# Patient Record
Sex: Female | Born: 1995 | Race: Black or African American | Hispanic: No | Marital: Single | Smoking: Never smoker
Health system: Southern US, Community
[De-identification: ages and names within clinical notes are randomized; demographics above are authoritative.]

## PROBLEM LIST (undated history)

## (undated) DIAGNOSIS — Z789 Other specified health status: Secondary | ICD-10-CM

## (undated) HISTORY — PX: NO PAST SURGERIES: SHX2092

## (undated) HISTORY — DX: Other specified health status: Z78.9

---
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
## 2013-04-04 ENCOUNTER — Encounter: Payer: Self-pay | Admitting: *Deleted

## 2013-04-12 ENCOUNTER — Ambulatory Visit (INDEPENDENT_AMBULATORY_CARE_PROVIDER_SITE_OTHER): Payer: 59

## 2013-04-12 DIAGNOSIS — Z3009 Encounter for other general counseling and advice on contraception: Secondary | ICD-10-CM

## 2013-04-12 DIAGNOSIS — Z30013 Encounter for initial prescription of injectable contraceptive: Secondary | ICD-10-CM

## 2013-04-12 MED ORDER — MEDROXYPROGESTERONE ACETATE 150 MG/ML IM SUSP
150.0000 mg | Freq: Once | INTRAMUSCULAR | Status: AC
  Administered 2013-04-12: 150 mg via INTRAMUSCULAR

## 2013-04-12 NOTE — Progress Notes (Deleted)
  Subjective:    Patient ID: Amanda Mcmahon, female    DOB: October 31, 1996, 17 y.o.   MRN: [id number]  HPI    Review of Systems     Objective:   Physical Exam        Assessment & Plan:

## 2013-04-12 NOTE — Progress Notes (Signed)
Received Depo Provera injection on 01/24/2013 at [facility].

## 2014-03-15 ENCOUNTER — Ambulatory Visit (INDEPENDENT_AMBULATORY_CARE_PROVIDER_SITE_OTHER): Payer: BC Managed Care – PPO | Admitting: *Deleted

## 2014-03-15 DIAGNOSIS — Z309 Encounter for contraceptive management, unspecified: Secondary | ICD-10-CM

## 2014-03-15 DIAGNOSIS — IMO0001 Reserved for inherently not codable concepts without codable children: Secondary | ICD-10-CM

## 2014-03-15 LAB — POCT URINE PREGNANCY: Preg Test, Ur: NEGATIVE

## 2014-03-15 MED ORDER — MEDROXYPROGESTERONE ACETATE 150 MG/ML IM SUSP
150.0000 mg | Freq: Once | INTRAMUSCULAR | Status: AC
  Administered 2014-03-15: 150 mg via INTRAMUSCULAR

## 2014-03-15 NOTE — Patient Instructions (Signed)
Next depo provera due July 30 - Aug 13

## 2014-03-15 NOTE — Progress Notes (Signed)
Patient got last depo provera at school. Consult with Amanda Mcmahon. Urine pregnancy test was negative done in office today. Pt states she has not had sex in the past 7 days. Ok to give depo provera today per Solonarolyn.

## 2014-05-09 ENCOUNTER — Ambulatory Visit (INDEPENDENT_AMBULATORY_CARE_PROVIDER_SITE_OTHER): Payer: BC Managed Care – PPO | Admitting: Nurse Practitioner

## 2014-05-09 ENCOUNTER — Encounter: Payer: Self-pay | Admitting: Nurse Practitioner

## 2014-05-09 VITALS — BP 110/74 | Temp 98.2°F | Ht 63.0 in | Wt 112.0 lb

## 2014-05-09 DIAGNOSIS — Z113 Encounter for screening for infections with a predominantly sexual mode of transmission: Secondary | ICD-10-CM

## 2014-05-09 DIAGNOSIS — R300 Dysuria: Secondary | ICD-10-CM

## 2014-05-09 DIAGNOSIS — R1011 Right upper quadrant pain: Secondary | ICD-10-CM

## 2014-05-09 LAB — POCT URINALYSIS DIPSTICK: pH, UA: 7

## 2014-05-09 LAB — POCT URINE PREGNANCY: PREG TEST UR: NEGATIVE

## 2014-05-09 MED ORDER — SULFAMETHOXAZOLE-TMP DS 800-160 MG PO TABS: 1.0000 | ORAL_TABLET | Freq: Two times a day (BID) | ORAL | Status: DC

## 2014-05-10 LAB — HEPATIC FUNCTION PANEL
ALK PHOS: 59 U/L (ref 39–117)
ALT: 9 U/L (ref 0–35)
AST: 14 U/L (ref 0–37)
Albumin: 4.2 g/dL (ref 3.5–5.2)
BILIRUBIN DIRECT: 0.3 mg/dL (ref 0.0–0.3)
BILIRUBIN INDIRECT: 1.3 mg/dL — AB (ref 0.2–1.2)
BILIRUBIN TOTAL: 1.6 mg/dL — AB (ref 0.2–1.2)
Total Protein: 6.5 g/dL (ref 6.0–8.3)

## 2014-05-10 LAB — CBC WITH DIFFERENTIAL/PLATELET
Basophils Absolute: 0 10*3/uL (ref 0.0–0.1)
Basophils Relative: 0 % (ref 0–1)
Eosinophils Absolute: 0.1 10*3/uL (ref 0.0–0.7)
Eosinophils Relative: 1 % (ref 0–5)
HCT: 38.1 % (ref 36.0–46.0)
Hemoglobin: 13.5 g/dL (ref 12.0–15.0)
LYMPHS ABS: 1.4 10*3/uL (ref 0.7–4.0)
LYMPHS PCT: 25 % (ref 12–46)
MCH: 30.4 pg (ref 26.0–34.0)
MCHC: 35.4 g/dL (ref 30.0–36.0)
MCV: 85.8 fL (ref 78.0–100.0)
Monocytes Absolute: 0.3 10*3/uL (ref 0.1–1.0)
Monocytes Relative: 6 % (ref 3–12)
NEUTROS PCT: 68 % (ref 43–77)
Neutro Abs: 3.9 10*3/uL (ref 1.7–7.7)
PLATELETS: 273 10*3/uL (ref 150–400)
RBC: 4.44 MIL/uL (ref 3.87–5.11)
RDW: 12.6 % (ref 11.5–15.5)
WBC: 5.7 10*3/uL (ref 4.0–10.5)

## 2014-05-10 LAB — BASIC METABOLIC PANEL
BUN: 7 mg/dL (ref 6–23)
CHLORIDE: 108 meq/L (ref 96–112)
CO2: 23 meq/L (ref 19–32)
CREATININE: 0.9 mg/dL (ref 0.50–1.10)
Calcium: 9.3 mg/dL (ref 8.4–10.5)
Glucose, Bld: 107 mg/dL — ABNORMAL HIGH (ref 70–99)
Potassium: 3.9 mEq/L (ref 3.5–5.3)
Sodium: 139 mEq/L (ref 135–145)

## 2014-05-10 LAB — GC/CHLAMYDIA PROBE AMP, URINE
CHLAMYDIA, SWAB/URINE, PCR: NEGATIVE
GC Probe Amp, Urine: NEGATIVE

## 2014-05-11 LAB — URINE CULTURE
Colony Count: NO GROWTH
Organism ID, Bacteria: NO GROWTH

## 2014-05-15 ENCOUNTER — Encounter: Payer: Self-pay | Admitting: Nurse Practitioner

## 2014-05-15 LAB — POCT UA - MICROSCOPIC ONLY
Bacteria, U Microscopic: NEGATIVE
RBC, URINE, MICROSCOPIC: NEGATIVE

## 2014-05-15 NOTE — Progress Notes (Signed)
Subjective:  Presents for complaints of low back pain and urinary problems for the past 3 weeks. Pain and pressure at the end of urination. Back pain began about a week ago. None today. Some urgency and frequency. No dysuria. No hematuria. Mid to lower abdominal pain. No fever. No nausea vomiting. Bowels normal limit. No vaginal discharge. Same sexual partner. No history of recent UTI. On Depo-Provera. Had some slight bleeding last week none since.  Objective:   BP 110/74  Temp(Src) 98.2 F (36.8 C)  Ht 5\' 3"  (1.6 m)  Wt 112 lb (50.803 kg)  BMI 19.84 kg/m2  LMP 03/09/2014 NAD. Alert, oriented. Lungs clear. Heart regular rate rhythm. No CVA tenderness. Tenderness noted in the right flank area towards the posterior axillary line into the right upper quadrant of the abdomen. No obvious organomegaly. No rash noted. Minimal suprapubic area tenderness. Urine hCG normal. Urine microscopic normal.  Assessment: Dysuria - Plan: POCT urinalysis dipstick, POCT UA - Microscopic Only, Urine culture  Screen for STD (sexually transmitted disease) - Plan: GC/chlamydia probe amp, urine, Urine culture  RUQ abdominal pain - Plan: POCT urine pregnancy, CBC with Differential, Hepatic function panel, Basic metabolic panel, Urine culture  Plan:  Meds ordered this encounter  Medications  . sulfamethoxazole-trimethoprim (BACTRIM DS) 800-160 MG per tablet    Sig: Take 1 tablet by mouth 2 (two) times daily.    Dispense:  14 tablet    Refill:  0    Order Specific Question:  Supervising Provider    Answer:  Merlyn AlbertLUKING, WILLIAM S [[id number]]   AZO for 48 hours then discontinue. Warning signs reviewed. Further followup based on test results, patient to call or go to ED sooner if symptoms worsen.

## 2014-06-01 ENCOUNTER — Ambulatory Visit (INDEPENDENT_AMBULATORY_CARE_PROVIDER_SITE_OTHER): Payer: BC Managed Care – PPO | Admitting: *Deleted

## 2014-06-01 DIAGNOSIS — Z3042 Encounter for surveillance of injectable contraceptive: Secondary | ICD-10-CM

## 2014-06-01 DIAGNOSIS — Z3049 Encounter for surveillance of other contraceptives: Secondary | ICD-10-CM

## 2014-06-01 MED ORDER — MEDROXYPROGESTERONE ACETATE 150 MG/ML IM SUSP
150.0000 mg | Freq: Once | INTRAMUSCULAR | Status: AC
  Administered 2014-06-01: 150 mg via INTRAMUSCULAR

## 2014-08-01 ENCOUNTER — Telehealth: Payer: Self-pay | Admitting: Family Medicine

## 2014-08-01 NOTE — Telephone Encounter (Signed)
Fax number [phone]

## 2014-08-01 NOTE — Telephone Encounter (Signed)
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 She will call back with fax number.

## 2014-08-01 NOTE — Telephone Encounter (Signed)
Done

## 2014-12-05 ENCOUNTER — Encounter: Payer: Self-pay | Admitting: Nurse Practitioner

## 2014-12-05 ENCOUNTER — Ambulatory Visit (INDEPENDENT_AMBULATORY_CARE_PROVIDER_SITE_OTHER): Payer: Managed Care, Other (non HMO) | Admitting: Nurse Practitioner

## 2014-12-05 VITALS — BP 110/68 | Ht 63.0 in | Wt 119.8 lb

## 2014-12-05 DIAGNOSIS — N644 Mastodynia: Secondary | ICD-10-CM

## 2014-12-05 DIAGNOSIS — Z113 Encounter for screening for infections with a predominantly sexual mode of transmission: Secondary | ICD-10-CM

## 2014-12-05 DIAGNOSIS — Z309 Encounter for contraceptive management, unspecified: Secondary | ICD-10-CM

## 2014-12-05 LAB — POCT URINE PREGNANCY: Preg Test, Ur: NEGATIVE

## 2014-12-05 MED ORDER — MEDROXYPROGESTERONE ACETATE 150 MG/ML IM SUSP
150.0000 mg | Freq: Once | INTRAMUSCULAR | Status: AC
  Administered 2014-12-05: 150 mg via INTRAMUSCULAR

## 2014-12-05 NOTE — Progress Notes (Signed)
Subjective:  Presents to discuss restarting Depo Provera. Was supposed to have an injection on 1/15. Went to student health but they told her she would need a PAP smear first. Same sexual partner. No vaginal bleeding. Breast tenderness for a few days. Last intercourse 2 days ago.   Objective:   BP 110/68 mmHg  Ht 5\' 3"  (1.6 m)  Wt 119 lb 12.8 oz (54.341 kg)  BMI 21.23 kg/m2  LMP 10/04/2014 (Approximate) NAD. Alert, oriented. Lungs clear. Heart RRR. Breast exam: dense tissue with multiple nodularity; tender to palpation; no dominant masses; axillae no adenopathy.  Results for orders placed or performed in visit on 12/05/14  POCT urine pregnancy  Result Value Ref Range   Preg Test, Ur Negative      Assessment: Encounter for contraceptive management, unspecified encounter - Plan: POCT urine pregnancy, medroxyPROGESTERone (DEPO-PROVERA) injection 150 mg  Screening examination for STD (sexually transmitted disease) - Plan: GC/chlamydia probe amp, urine  Breast tenderness  Plan:  Meds ordered this encounter  Medications  . medroxyPROGESTERone (DEPO-PROVERA) injection 150 mg    Sig:    Patient understands there is a slim chance that she is pregnant even with neg test since she had intercourse so recently. Would like to get Depo Provera anyway. Advised her to take home pregnancy test in 7-10 days and call back if positive. Discussed safe sex issues. Recommend PE and first PAP this summer after she turns 19 per PAP guidelines. NSAIDs and ice/heat applications to breasts. Should resolve once she has been on Depo for a few weeks.

## 2014-12-06 LAB — GC/CHLAMYDIA PROBE AMP, URINE
CHLAMYDIA, SWAB/URINE, PCR: NEGATIVE
GC Probe Amp, Urine: NEGATIVE

## 2015-02-27 ENCOUNTER — Ambulatory Visit (INDEPENDENT_AMBULATORY_CARE_PROVIDER_SITE_OTHER): Payer: Managed Care, Other (non HMO) | Admitting: *Deleted

## 2015-02-27 DIAGNOSIS — Z309 Encounter for contraceptive management, unspecified: Secondary | ICD-10-CM | POA: Diagnosis not present

## 2015-02-27 MED ORDER — MEDROXYPROGESTERONE ACETATE 150 MG/ML IM SUSP
150.0000 mg | Freq: Once | INTRAMUSCULAR | Status: AC
  Administered 2015-02-27: 150 mg via INTRAMUSCULAR

## 2015-05-16 ENCOUNTER — Ambulatory Visit: Payer: Managed Care, Other (non HMO)

## 2015-05-17 ENCOUNTER — Ambulatory Visit (INDEPENDENT_AMBULATORY_CARE_PROVIDER_SITE_OTHER): Payer: Managed Care, Other (non HMO)

## 2015-05-17 DIAGNOSIS — Z3042 Encounter for surveillance of injectable contraceptive: Secondary | ICD-10-CM | POA: Diagnosis not present

## 2015-05-17 MED ORDER — MEDROXYPROGESTERONE ACETATE 150 MG/ML IM SUSP
150.0000 mg | Freq: Once | INTRAMUSCULAR | Status: AC
  Administered 2015-05-17: 150 mg via INTRAMUSCULAR

## 2015-08-02 ENCOUNTER — Ambulatory Visit (INDEPENDENT_AMBULATORY_CARE_PROVIDER_SITE_OTHER): Payer: Managed Care, Other (non HMO) | Admitting: *Deleted

## 2015-08-02 DIAGNOSIS — Z3042 Encounter for surveillance of injectable contraceptive: Secondary | ICD-10-CM

## 2015-08-02 MED ORDER — MEDROXYPROGESTERONE ACETATE 150 MG/ML IM SUSP
150.0000 mg | Freq: Once | INTRAMUSCULAR | Status: AC
  Administered 2015-08-02: 150 mg via INTRAMUSCULAR

## 2015-11-03 NOTE — L&D Delivery Note (Signed)
Delivery Note At 5:42 PM a viable female was delivered via Vaginal, Spontaneous Delivery (Presentation: ROA ; Vertex  ).  APGAR: 4, 7; weight pending .   Placenta status: delivered intact with gentle traction.  Cord: 3 vessles  with the following complications: none .    Anesthesia:  epidural Episiotomy: None Lacerations: 2nd degree Suture Repair: 3.0 vicryl Est. Blood Loss (mL): 100  Mom to postpartum.  Baby to Couplet care / Skin to Skin.  Ernestina Pennaicholas Schenk 09/19/2016, 6:11 PM

## 2016-01-24 ENCOUNTER — Other Ambulatory Visit (HOSPITAL_COMMUNITY)
Admission: RE | Admit: 2016-01-24 | Discharge: 2016-01-24 | Disposition: A | Discharge status: Home / Self Care | Payer: Managed Care, Other (non HMO) | Source: Ambulatory Visit | Attending: Nurse Practitioner | Admitting: Nurse Practitioner

## 2016-01-24 ENCOUNTER — Ambulatory Visit (INDEPENDENT_AMBULATORY_CARE_PROVIDER_SITE_OTHER): Payer: Managed Care, Other (non HMO) | Admitting: Nurse Practitioner

## 2016-01-24 VITALS — BP 112/78 | Ht 63.0 in | Wt 139.2 lb

## 2016-01-24 DIAGNOSIS — Z3200 Encounter for pregnancy test, result unknown: Secondary | ICD-10-CM | POA: Diagnosis present

## 2016-01-24 DIAGNOSIS — N912 Amenorrhea, unspecified: Secondary | ICD-10-CM

## 2016-01-24 LAB — HCG, QUANTITATIVE, PREGNANCY: hCG, Beta Chain, Quant, S: 9093 m[IU]/mL — ABNORMAL HIGH (ref <5)

## 2016-01-24 LAB — POCT URINE PREGNANCY: PREG TEST UR: POSITIVE — AB

## 2016-01-25 ENCOUNTER — Encounter: Payer: Self-pay | Admitting: Nurse Practitioner

## 2016-01-25 NOTE — Progress Notes (Signed)
Subjective:  Presents to discuss 2 positive pregnancy test taken outside the office. Had her last Depo-Provera injection on 9/30. No cycle for the past 4-5 months. No vaginal discharge, fever or spotting.  Objective:   BP 112/78 mmHg  Ht 5\' 3"  (1.6 m)  Wt 139 lb 4 oz (63.163 kg)  BMI 24.67 kg/m2  LMP  (LMP Unknown) NAD. Alert, oriented. Lungs clear. Heart regular rate rhythm. Abdomen soft nondistended nontender. No palpable fundal height. Results for orders placed or performed in visit on 01/24/16  POCT urine pregnancy  Result Value Ref Range   Preg Test, Ur Positive (A) Negative     Assessment: Encounter for pregnancy test - Plan: POCT urine pregnancy, hCG, quantitative, pregnancy  Amenorrhea - Plan: hCG, quantitative, pregnancy  Plan: Stat quantitative hCG to get an estimate of gestation. Patient to call and make appointment for prenatal visit once this is available. [facility] [place]. Also discussed basic early prenatal care. Start OTC prenatal vitamin. Patient was in a hot tub last weekend, advised her to avoid that from now on.

## 2016-02-07 ENCOUNTER — Other Ambulatory Visit: Payer: Self-pay | Admitting: Obstetrics and Gynecology

## 2016-02-07 DIAGNOSIS — O3680X0 Pregnancy with inconclusive fetal viability, not applicable or unspecified: Secondary | ICD-10-CM

## 2016-02-10 ENCOUNTER — Ambulatory Visit (INDEPENDENT_AMBULATORY_CARE_PROVIDER_SITE_OTHER): Payer: Managed Care, Other (non HMO)

## 2016-02-10 DIAGNOSIS — O3680X0 Pregnancy with inconclusive fetal viability, not applicable or unspecified: Secondary | ICD-10-CM

## 2016-02-10 DIAGNOSIS — Z3A08 8 weeks gestation of pregnancy: Secondary | ICD-10-CM

## 2016-02-10 NOTE — Progress Notes (Signed)
US 8wks,single IUP w/ys, pos fht 148 bpm,normal ov's bilat,crl 16.605mm

## 2016-02-19 ENCOUNTER — Encounter: Payer: Self-pay | Admitting: Women's Health

## 2016-02-19 ENCOUNTER — Ambulatory Visit (INDEPENDENT_AMBULATORY_CARE_PROVIDER_SITE_OTHER): Payer: Managed Care, Other (non HMO) | Admitting: Women's Health

## 2016-02-19 ENCOUNTER — Other Ambulatory Visit (HOSPITAL_COMMUNITY)
Admission: RE | Admit: 2016-02-19 | Discharge: 2016-02-19 | Disposition: A | Discharge status: Home / Self Care | Payer: Managed Care, Other (non HMO) | Source: Ambulatory Visit | Attending: Obstetrics & Gynecology | Admitting: Obstetrics & Gynecology

## 2016-02-19 VITALS — BP 104/60 | HR 92 | Wt 141.0 lb

## 2016-02-19 DIAGNOSIS — Z369 Encounter for antenatal screening, unspecified: Secondary | ICD-10-CM

## 2016-02-19 DIAGNOSIS — Z113 Encounter for screening for infections with a predominantly sexual mode of transmission: Secondary | ICD-10-CM | POA: Diagnosis present

## 2016-02-19 DIAGNOSIS — Z01419 Encounter for gynecological examination (general) (routine) without abnormal findings: Secondary | ICD-10-CM | POA: Insufficient documentation

## 2016-02-19 DIAGNOSIS — Z124 Encounter for screening for malignant neoplasm of cervix: Secondary | ICD-10-CM

## 2016-02-19 DIAGNOSIS — Z0283 Encounter for blood-alcohol and blood-drug test: Secondary | ICD-10-CM

## 2016-02-19 DIAGNOSIS — Z3401 Encounter for supervision of normal first pregnancy, first trimester: Secondary | ICD-10-CM | POA: Diagnosis not present

## 2016-02-19 DIAGNOSIS — Z3682 Encounter for antenatal screening for nuchal translucency: Secondary | ICD-10-CM

## 2016-02-19 DIAGNOSIS — Z1389 Encounter for screening for other disorder: Secondary | ICD-10-CM

## 2016-02-19 DIAGNOSIS — Z3400 Encounter for supervision of normal first pregnancy, unspecified trimester: Secondary | ICD-10-CM | POA: Insufficient documentation

## 2016-02-19 DIAGNOSIS — Z331 Pregnant state, incidental: Secondary | ICD-10-CM

## 2016-02-19 LAB — POCT URINALYSIS DIPSTICK
Blood, UA: NEGATIVE
Glucose, UA: NEGATIVE
Ketones, UA: NEGATIVE
LEUKOCYTES UA: NEGATIVE
NITRITE UA: NEGATIVE
PROTEIN UA: NEGATIVE

## 2016-02-19 MED ORDER — CITRANATAL ASSURE 35-1 & 300 MG PO MISC: ORAL | Status: DC

## 2016-02-19 MED ORDER — DOXYLAMINE-PYRIDOXINE 10-10 MG PO TBEC: DELAYED_RELEASE_TABLET | ORAL | Status: DC

## 2016-02-19 NOTE — Patient Instructions (Signed)
Zyrtec or Claritin for allergies   Nausea & Vomiting  Have saltine crackers or pretzels by your bed and eat a few bites before you raise your head out of bed in the morning  Eat small frequent meals throughout the day instead of large meals  Drink plenty of fluids throughout the day to stay hydrated, just don't drink a lot of fluids with your meals.  This can make your stomach fill up faster making you feel sick  Do not brush your teeth right after you eat  Products with real ginger are good for nausea, like ginger ale and ginger hard candy Make sure it says made with real ginger!  Sucking on sour candy like lemon heads is also good for nausea  If your prenatal vitamins make you nauseated, take them at night so you will sleep through the nausea  Sea Bands  If you feel like you need medicine for the nausea & vomiting please let us know  If you are unable to keep any fluids or food down please let us know   Constipation  Drink plenty of fluid, preferably water, throughout the day  Eat foods high in fiber such as fruits, vegetables, and grains  Exercise, such as walking, is a good way to keep your bowels regular  Drink warm fluids, especially warm prune juice, or decaf coffee  Eat a 1/2 cup of real oatmeal (not instant), 1/2 cup applesauce, and 1/2-1 cup warm prune juice every day  If needed, you may take Colace (docusate sodium) stool softener once or twice a day to help keep the stool soft. If you are pregnant, wait until you are out of your first trimester (12-14 weeks of pregnancy)  If you still are having problems with constipation, you may take Miralax once daily as needed to help keep your bowels regular.  If you are pregnant, wait until you are out of your first trimester (12-14 weeks of pregnancy)   First Trimester of Pregnancy The first trimester of pregnancy is from week 1 until the end of week 12 (months 1 through 3). A week after a sperm fertilizes an egg, the egg  will implant on the wall of the uterus. This embryo will begin to develop into a baby. Genes from you and your partner are forming the baby. The female genes determine whether the baby is a boy or a girl. At 6-8 weeks, the eyes and face are formed, and the heartbeat can be seen on ultrasound. At the end of 12 weeks, all the baby's organs are formed.  Now that you are pregnant, you will want to do everything you can to have a healthy baby. Two of the most important things are to get good prenatal care and to follow your health care provider's instructions. Prenatal care is all the medical care you receive before the baby's birth. This care will help prevent, find, and treat any problems during the pregnancy and childbirth. BODY CHANGES Your body goes through many changes during pregnancy. The changes vary from woman to woman.   You may gain or lose a couple of pounds at first.  You may feel sick to your stomach (nauseous) and throw up (vomit). If the vomiting is uncontrollable, call your health care provider.  You may tire easily.  You may develop headaches that can be relieved by medicines approved by your health care provider.  You may urinate more often. Painful urination may mean you have a bladder infection.  You may develop  heartburn as a result of your pregnancy.  You may develop constipation because certain hormones are causing the muscles that push waste through your intestines to slow down.  You may develop hemorrhoids or swollen, bulging veins (varicose veins).  Your breasts may begin to grow larger and become tender. Your nipples may stick out more, and the tissue that surrounds them (areola) may become darker.  Your gums may bleed and may be sensitive to brushing and flossing.  Dark spots or blotches (chloasma, mask of pregnancy) may develop on your face. This will likely fade after the baby is born.  Your menstrual periods will stop.  You may have a loss of appetite.  You  may develop cravings for certain kinds of food.  You may have changes in your emotions from day to day, such as being excited to be pregnant or being concerned that something may go wrong with the pregnancy and baby.  You may have more vivid and strange dreams.  You may have changes in your hair. These can include thickening of your hair, rapid growth, and changes in texture. Some women also have hair loss during or after pregnancy, or hair that feels dry or thin. Your hair will most likely return to normal after your baby is born. WHAT TO EXPECT AT YOUR PRENATAL VISITS During a routine prenatal visit:  You will be weighed to make sure you and the baby are growing normally.  Your blood pressure will be taken.  Your abdomen will be measured to track your baby's growth.  The fetal heartbeat will be listened to starting around week 10 or 12 of your pregnancy.  Test results from any previous visits will be discussed. Your health care provider may ask you:  How you are feeling.  If you are feeling the baby move.  If you have had any abnormal symptoms, such as leaking fluid, bleeding, severe headaches, or abdominal cramping.  If you have any questions. Other tests that may be performed during your first trimester include:  Blood tests to find your blood type and to check for the presence of any previous infections. They will also be used to check for low iron levels (anemia) and Rh antibodies. Later in the pregnancy, blood tests for diabetes will be done along with other tests if problems develop.  Urine tests to check for infections, diabetes, or protein in the urine.  An ultrasound to confirm the proper growth and development of the baby.  An amniocentesis to check for possible genetic problems.  Fetal screens for spina bifida and Down syndrome.  You may need other tests to make sure you and the baby are doing well. HOME CARE INSTRUCTIONS  Medicines  Follow your health care  provider's instructions regarding medicine use. Specific medicines may be either safe or unsafe to take during pregnancy.  Take your prenatal vitamins as directed.  If you develop constipation, try taking a stool softener if your health care provider approves. Diet  Eat regular, well-balanced meals. Choose a variety of foods, such as meat or vegetable-based protein, fish, milk and low-fat dairy products, vegetables, fruits, and whole grain breads and cereals. Your health care provider will help you determine the amount of weight gain that is right for you.  Avoid raw meat and uncooked cheese. These carry germs that can cause birth defects in the baby.  Eating four or five small meals rather than three large meals a day may help relieve nausea and vomiting. If you start to feel  nauseous, eating a few soda crackers can be helpful. Drinking liquids between meals instead of during meals also seems to help nausea and vomiting.  If you develop constipation, eat more high-fiber foods, such as fresh vegetables or fruit and whole grains. Drink enough fluids to keep your urine clear or pale yellow. Activity and Exercise  Exercise only as directed by your health care provider. Exercising will help you:  Control your weight.  Stay in shape.  Be prepared for labor and delivery.  Experiencing pain or cramping in the lower abdomen or low back is a good sign that you should stop exercising. Check with your health care provider before continuing normal exercises.  Try to avoid standing for long periods of time. Move your legs often if you must stand in one place for a long time.  Avoid heavy lifting.  Wear low-heeled shoes, and practice good posture.  You may continue to have sex unless your health care provider directs you otherwise. Relief of Pain or Discomfort  Wear a good support bra for breast tenderness.   Take warm sitz baths to soothe any pain or discomfort caused by hemorrhoids. Use  hemorrhoid cream if your health care provider approves.   Rest with your legs elevated if you have leg cramps or low back pain.  If you develop varicose veins in your legs, wear support hose. Elevate your feet for 15 minutes, 3-4 times a day. Limit salt in your diet. Prenatal Care  Schedule your prenatal visits by the twelfth week of pregnancy. They are usually scheduled monthly at first, then more often in the last 2 months before delivery.  Write down your questions. Take them to your prenatal visits.  Keep all your prenatal visits as directed by your health care provider. Safety  Wear your seat belt at all times when driving.  Make a list of emergency phone numbers, including numbers for family, friends, the hospital, and police and fire departments. General Tips  Ask your health care provider for a referral to a local prenatal education class. Begin classes no later than at the beginning of month 6 of your pregnancy.  Ask for help if you have counseling or nutritional needs during pregnancy. Your health care provider can offer advice or refer you to specialists for help with various needs.  Do not use hot tubs, steam rooms, or saunas.  Do not douche or use tampons or scented sanitary pads.  Do not cross your legs for long periods of time.  Avoid cat litter boxes and soil used by cats. These carry germs that can cause birth defects in the baby and possibly loss of the fetus by miscarriage or stillbirth.  Avoid all smoking, herbs, alcohol, and medicines not prescribed by your health care provider. Chemicals in these affect the formation and growth of the baby.  Schedule a dentist appointment. At home, brush your teeth with a soft toothbrush and be gentle when you floss. SEEK MEDICAL CARE IF:   You have dizziness.  You have mild pelvic cramps, pelvic pressure, or nagging pain in the abdominal area.  You have persistent nausea, vomiting, or diarrhea.  You have a bad  smelling vaginal discharge.  You have pain with urination.  You notice increased swelling in your face, hands, legs, or ankles. SEEK IMMEDIATE MEDICAL CARE IF:   You have a fever.  You are leaking fluid from your vagina.  You have spotting or bleeding from your vagina.  You have severe abdominal cramping or pain.  You have rapid weight gain or loss.  You vomit blood or material that looks like coffee grounds.  You are exposed to [place] measles and have never had them.  You are exposed to fifth disease or chickenpox.  You develop a severe headache.  You have shortness of breath.  You have any kind of trauma, such as from a fall or a car accident. Document Released: 10/13/2001 Document Revised: 03/05/2014 Document Reviewed: 08/29/2013 [facility] Patient Information 2015 [place], [place]. This information is not intended to replace advice given to you by your health care provider. Make sure you discuss any questions you have with your health care provider.

## 2016-02-19 NOTE — Progress Notes (Signed)
  Subjective:  Carmine SavoyKierra D Sobocinski is a 20 y.o. G1P0 African American female at 8048w2d by 8wk u/s, being seen today for her first obstetrical visit.  Her obstetrical history is significant for primigravida.  Pregnancy history fully reviewed.  Patient reports allergies- runny/itchy eyes, nose, throat, sneezing. N/V- vomiting not daily- requests meds. Denies vb, cramping, uti s/s, abnormal/malodorous vag d/c, or vulvovaginal itching/irritation.  BP 104/60 mmHg  Pulse 92  Wt 141 lb (63.957 kg)  LMP  (LMP Unknown)  HISTORY: OB History  Gravida Para Term Preterm AB SAB TAB Ectopic Multiple Living  1             # Outcome Date GA Lbr Len/2nd Weight Sex Delivery Anes PTL Lv  1 Current              Past Medical History  Diagnosis Date  . Medical history non-contributory    Past Surgical History  Procedure Laterality Date  . No past surgeries     Family History  Problem Relation Age of Onset  . Arthritis Mother   . Asthma Maternal Grandmother   . Cancer Maternal Grandmother     lung  . COPD Maternal Grandmother   . Depression Maternal Grandmother   . Diabetes Maternal Grandmother   . Hypertension Maternal Grandmother   . Stroke Maternal Grandmother   . Hypertension Maternal Grandfather   . Diabetes Paternal Grandmother     Exam   System:     General: Well developed & nourished, no acute distress   Skin: Warm & dry, normal coloration and turgor, no rashes   Neurologic: Alert & oriented, normal mood   Cardiovascular: Regular rate & rhythm   Respiratory: Effort & rate normal, LCTAB, acyanotic   Abdomen: Soft, non tender   Extremities: normal strength, tone   Pelvic Exam:    Perineum: Normal perineum   Vulva: Normal, no lesions   Vagina:  Normal mucosa, normal discharge   Cervix: VERY ANTERIOR, Normal, bulbous, appears closed   Uterus: Normal size/shape/contour for GA   Thin prep pap smear obtained w/ reflex high risk HPV cotesting FHR: 160s via informal u/s   Assessment:    Pregnancy: G1P0 Patient Active Problem List   Diagnosis Date Noted  . Supervision of normal first pregnancy 02/19/2016    Priority: High  . KNEE SPRAIN 07/28/2010    4348w2d G1P0 New OB visit N/V of pregnancy Seasonal allergies  Plan:  Initial labs drawn Continue prenatal vitamins Problem list reviewed and updated Reviewed n/v relief measures and warning s/s to report Rx diclegis, coupon card given- if has any trouble to let us know, will rx phenergan.  Zyrtec or Claritin for allergies Reviewed recommended weight gain based on pre-gravid BMI Encouraged well-balanced diet Genetic Screening discussed Integrated Screen: requested Cystic fibrosis screening discussed declined Ultrasound discussed; fetal survey: requested Follow up in 3 weeks for 1st it/nt and visit [facility] completed  Marge DuncansBooker, Tobey Schmelzle Randall CNM, [facility]NP[facility]BC 02/19/2016 11:38 AM

## 2016-02-20 LAB — URINALYSIS, ROUTINE W REFLEX MICROSCOPIC
Bilirubin, UA: NEGATIVE
GLUCOSE, UA: NEGATIVE
KETONES UA: NEGATIVE
Leukocytes, UA: NEGATIVE
NITRITE UA: NEGATIVE
PROTEIN UA: NEGATIVE
RBC, UA: NEGATIVE
SPEC GRAV UA: 1.023 (ref 1.005–1.030)
UUROB: 1 mg/dL (ref 0.2–1.0)
pH, UA: 6.5 (ref 5.0–7.5)

## 2016-02-20 LAB — CBC
HEMATOCRIT: 39.8 % (ref 34.0–46.6)
HEMOGLOBIN: 14 g/dL (ref 11.1–15.9)
MCH: 31.7 pg (ref 26.6–33.0)
MCHC: 35.2 g/dL (ref 31.5–35.7)
MCV: 90 fL (ref 79–97)
PLATELETS: 308 10*3/uL (ref 150–379)
RBC: 4.42 x10E6/uL (ref 3.77–5.28)
RDW: 12.8 % (ref 12.3–15.4)
WBC: 11.1 10*3/uL — AB (ref 3.4–10.8)

## 2016-02-20 LAB — VARICELLA ZOSTER ANTIBODY, IGG: Varicella zoster IgG: 1942 index (ref >165)

## 2016-02-20 LAB — PMP SCREEN PROFILE (10S), URINE
Amphetamine Screen, Ur: NEGATIVE ng/mL
BARBITURATE SCRN UR: NEGATIVE ng/mL
Benzodiazepine Screen, Urine: NEGATIVE ng/mL
COCAINE(METAB.) SCREEN, URINE: NEGATIVE ng/mL
Cannabinoids Ur Ql Scn: NEGATIVE ng/mL
Creatinine(Crt), U: 154.9 mg/dL (ref 20.0–300.0)
METHADONE SCREEN, URINE: NEGATIVE ng/mL
OPIATE SCRN UR: NEGATIVE ng/mL
Oxycodone+Oxymorphone Ur Ql Scn: NEGATIVE ng/mL
PCP SCRN UR: NEGATIVE ng/mL
PH UR, DRUG SCRN: 6.1 (ref 4.5–8.9)
Propoxyphene, Screen: NEGATIVE ng/mL

## 2016-02-20 LAB — ABO/RH: RH TYPE: POSITIVE

## 2016-02-20 LAB — HIV ANTIBODY (ROUTINE TESTING W REFLEX): HIV Screen 4th Generation wRfx: NONREACTIVE

## 2016-02-20 LAB — SICKLE CELL SCREEN: SICKLE CELL SCREEN: NEGATIVE

## 2016-02-20 LAB — RPR: RPR: NONREACTIVE

## 2016-02-20 LAB — CYTOLOGY - PAP

## 2016-02-20 LAB — HEPATITIS B SURFACE ANTIGEN: Hepatitis B Surface Ag: NEGATIVE

## 2016-02-20 LAB — ANTIBODY SCREEN: Antibody Screen: NEGATIVE

## 2016-02-20 LAB — RUBELLA SCREEN: RUBELLA: 13.3 {index} (ref >0.99)

## 2016-02-21 LAB — URINE CULTURE: ORGANISM ID, BACTERIA: NO GROWTH

## 2016-03-11 ENCOUNTER — Ambulatory Visit (INDEPENDENT_AMBULATORY_CARE_PROVIDER_SITE_OTHER): Payer: Managed Care, Other (non HMO) | Admitting: Advanced Practice Midwife

## 2016-03-11 ENCOUNTER — Ambulatory Visit (INDEPENDENT_AMBULATORY_CARE_PROVIDER_SITE_OTHER): Payer: Managed Care, Other (non HMO)

## 2016-03-11 VITALS — BP 100/60 | HR 68 | Wt 141.0 lb

## 2016-03-11 DIAGNOSIS — Z1389 Encounter for screening for other disorder: Secondary | ICD-10-CM

## 2016-03-11 DIAGNOSIS — Z331 Pregnant state, incidental: Secondary | ICD-10-CM

## 2016-03-11 DIAGNOSIS — Z3682 Encounter for antenatal screening for nuchal translucency: Secondary | ICD-10-CM

## 2016-03-11 DIAGNOSIS — Z36 Encounter for antenatal screening of mother: Secondary | ICD-10-CM

## 2016-03-11 DIAGNOSIS — Z3401 Encounter for supervision of normal first pregnancy, first trimester: Secondary | ICD-10-CM

## 2016-03-11 LAB — POCT URINALYSIS DIPSTICK
Blood, UA: NEGATIVE
Glucose, UA: NEGATIVE
KETONES UA: NEGATIVE
Leukocytes, UA: NEGATIVE
Nitrite, UA: NEGATIVE
PROTEIN UA: NEGATIVE

## 2016-03-11 NOTE — Progress Notes (Signed)
G1P0 8452w2d Estimated Date of Delivery: 09/21/16  There were no vitals taken for this visit.   BP weight and urine results all reviewed and noted.  Please refer to the obstetrical flow sheet for the fundal height and fetal heart rate documentation:  Patient denies any bleeding and no rupture of membranes symptoms or regular contractions.   US 12+2 wks,measurements c/w dates,normal ov's bilat,fhr 164 bpm,crl 57.0 mm,NB present,NT 1.2 mm,post pl gr 0        Patient is without complaints. All questions were answered.  Orders Placed This Encounter  Procedures  . POCT urinalysis dipstick    Plan:  Continued routine obstetrical care, NT/It today  Return in about 4 weeks (around 04/08/2016) for LROB, 2nd IT.

## 2016-03-11 NOTE — Progress Notes (Signed)
US 12+2 wks,measurements c/w dates,normal ov's bilat,fhr 164 bpm,crl 57.0 mm,NB present,NT 1.2 mm,post pl gr 0

## 2016-03-17 ENCOUNTER — Telehealth: Payer: Self-pay | Admitting: *Deleted

## 2016-03-17 NOTE — Telephone Encounter (Signed)
Pt c/o sharp right sided, no vaginal bleeding. Pt informed to push water, rest, take Tylenol. If no improvement call our office back. Pt verbalized understanding.

## 2016-03-21 ENCOUNTER — Encounter (HOSPITAL_COMMUNITY): Payer: Self-pay | Admitting: Emergency Medicine

## 2016-03-21 ENCOUNTER — Emergency Department (HOSPITAL_COMMUNITY)
Admission: EM | Admit: 2016-03-21 | Discharge: 2016-03-22 | Disposition: A | Discharge status: Home / Self Care | Payer: Managed Care, Other (non HMO) | Attending: Emergency Medicine | Admitting: Emergency Medicine

## 2016-03-21 ENCOUNTER — Emergency Department (HOSPITAL_COMMUNITY): Payer: Managed Care, Other (non HMO)

## 2016-03-21 DIAGNOSIS — Y998 Other external cause status: Secondary | ICD-10-CM | POA: Diagnosis not present

## 2016-03-21 DIAGNOSIS — Y9389 Activity, other specified: Secondary | ICD-10-CM | POA: Diagnosis not present

## 2016-03-21 DIAGNOSIS — O9A212 Injury, poisoning and certain other consequences of external causes complicating pregnancy, second trimester: Secondary | ICD-10-CM | POA: Insufficient documentation

## 2016-03-21 DIAGNOSIS — Z3A13 13 weeks gestation of pregnancy: Secondary | ICD-10-CM | POA: Diagnosis not present

## 2016-03-21 DIAGNOSIS — S199XXA Unspecified injury of neck, initial encounter: Secondary | ICD-10-CM | POA: Insufficient documentation

## 2016-03-21 DIAGNOSIS — S8991XA Unspecified injury of right lower leg, initial encounter: Secondary | ICD-10-CM | POA: Insufficient documentation

## 2016-03-21 DIAGNOSIS — Y92410 Unspecified street and highway as the place of occurrence of the external cause: Secondary | ICD-10-CM | POA: Insufficient documentation

## 2016-03-21 DIAGNOSIS — S3991XA Unspecified injury of abdomen, initial encounter: Secondary | ICD-10-CM | POA: Diagnosis not present

## 2016-03-21 DIAGNOSIS — Z3490 Encounter for supervision of normal pregnancy, unspecified, unspecified trimester: Secondary | ICD-10-CM

## 2016-03-21 DIAGNOSIS — M542 Cervicalgia: Secondary | ICD-10-CM

## 2016-03-21 DIAGNOSIS — Z3401 Encounter for supervision of normal first pregnancy, first trimester: Secondary | ICD-10-CM

## 2016-03-21 DIAGNOSIS — R109 Unspecified abdominal pain: Secondary | ICD-10-CM

## 2016-03-21 DIAGNOSIS — Z79899 Other long term (current) drug therapy: Secondary | ICD-10-CM | POA: Insufficient documentation

## 2016-03-21 DIAGNOSIS — M25561 Pain in right knee: Secondary | ICD-10-CM

## 2016-03-21 LAB — URINALYSIS, ROUTINE W REFLEX MICROSCOPIC
Bilirubin Urine: NEGATIVE
Glucose, UA: NEGATIVE mg/dL
Hgb urine dipstick: NEGATIVE
Ketones, ur: NEGATIVE mg/dL
NITRITE: NEGATIVE
PROTEIN: NEGATIVE mg/dL
SPECIFIC GRAVITY, URINE: 1.012 (ref 1.005–1.030)
pH: 7.5 (ref 5.0–8.0)

## 2016-03-21 LAB — URINE MICROSCOPIC-ADD ON

## 2016-03-21 MED ORDER — ACETAMINOPHEN 325 MG PO TABS
650.0000 mg | ORAL_TABLET | Freq: Once | ORAL | Status: AC
  Administered 2016-03-21: 650 mg via ORAL
  Filled 2016-03-21: qty 2

## 2016-03-21 MED ORDER — ACETAMINOPHEN 325 MG PO TABS: 650.0000 mg | ORAL_TABLET | Freq: Once | ORAL | Status: DC

## 2016-03-21 NOTE — ED Notes (Signed)
EDP at bedside  
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
## 2016-03-21 NOTE — ED Notes (Signed)
In room with EDP, several family members/friends at bedside (more than 2) also on Facetime with staff in background. EDP asked friends to stop.

## 2016-03-21 NOTE — Discharge Instructions (Signed)
Follow-up with your primary care provider regarding your knee and neck pain and follow-up with your OB/GYN on Monday regarding your visit to the emergency department today.  Return to the emergency department if you experience vaginal bleeding, worsening abdominal cramps, vomiting, fever, chills, worsening knee pain, numbness/tingling in your legs or feet.  Joint Pain Joint pain, which is also called arthralgia, can be caused by many things. Joint pain often goes away when you follow your health care provider's instructions for relieving pain at home. However, joint pain can also be caused by conditions that require further treatment. Common causes of joint pain include:  Bruising in the area of the joint.  Overuse of the joint.  Wear and tear on the joints that occur with aging (osteoarthritis).  Various other forms of arthritis.  A buildup of a crystal form of uric acid in the joint (gout).  Infections of the joint (septic arthritis) or of the bone (osteomyelitis). Your health care provider may recommend medicine to help with the pain. If your joint pain continues, additional tests may be needed to diagnose your condition. HOME CARE INSTRUCTIONS Watch your condition for any changes. Follow these instructions as directed to lessen the pain that you are feeling.  Take medicines only as directed by your health care provider.  Rest the affected area for as long as your health care provider says that you should. If directed to do so, raise the painful joint above the level of your heart while you are sitting or lying down.  Do not do things that cause or worsen pain.  If directed, apply ice to the painful area:  Put ice in a plastic bag.  Place a towel between your skin and the bag.  Leave the ice on for 20 minutes, 2-3 times per day.  Wear an elastic bandage, splint, or sling as directed by your health care provider. Loosen the elastic bandage or splint if your fingers or toes  become numb and tingle, or if they turn cold and blue.  Begin exercising or stretching the affected area as directed by your health care provider. Ask your health care provider what types of exercise are safe for you.  Keep all follow-up visits as directed by your health care provider. This is important. SEEK MEDICAL CARE IF:  Your pain increases, and medicine does not help.  Your joint pain does not improve within 3 days.  You have increased bruising or swelling.  You have a fever.  You lose 10 lb (4.5 kg) or more without trying. SEEK IMMEDIATE MEDICAL CARE IF:  You are not able to move the joint.  Your fingers or toes become numb or they turn cold and blue.   This information is not intended to replace advice given to you by your health care provider. Make sure you discuss any questions you have with your health care provider.   Document Released: 10/19/2005 Document Revised: 11/09/2014 Document Reviewed: 07/31/2014 Elsevier Interactive Patient Education [employer]2016 Elsevier Inc.

## 2016-03-21 NOTE — ED Notes (Signed)
This note also relates to the following rows which could not be included: Pulse Rate - Cannot attach notes to unvalidated device data SpO2 - Cannot attach notes to unvalidated device data   Pt reports some mild cramping in lower abdomen; no bleeding and no leaking of fluid. RROB went over PTL precautions; pt and significant other verbalized understanding. [facility]

## 2016-03-21 NOTE — ED Notes (Signed)
OB RR at bedside 

## 2016-03-21 NOTE — ED Notes (Signed)
Charge RN aware that family wants to speak to him
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
## 2016-03-22 LAB — WET PREP, GENITAL
SPERM: NONE SEEN
TRICH WET PREP: NONE SEEN
YEAST WET PREP: NONE SEEN

## 2016-03-23 ENCOUNTER — Encounter: Payer: Self-pay | Admitting: Obstetrics and Gynecology

## 2016-03-23 ENCOUNTER — Ambulatory Visit (INDEPENDENT_AMBULATORY_CARE_PROVIDER_SITE_OTHER): Payer: Managed Care, Other (non HMO) | Admitting: Obstetrics & Gynecology

## 2016-03-23 DIAGNOSIS — Z1389 Encounter for screening for other disorder: Secondary | ICD-10-CM

## 2016-03-23 DIAGNOSIS — Z331 Pregnant state, incidental: Secondary | ICD-10-CM

## 2016-03-23 LAB — POCT URINALYSIS DIPSTICK
GLUCOSE UA: NEGATIVE
KETONES UA: NEGATIVE
LEUKOCYTES UA: NEGATIVE
Nitrite, UA: NEGATIVE
PROTEIN UA: NEGATIVE

## 2016-03-23 LAB — GC/CHLAMYDIA PROBE AMP (~~LOC~~) NOT AT ARMC
Chlamydia: NEGATIVE
Neisseria Gonorrhea: NEGATIVE
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
## 2016-04-08 ENCOUNTER — Ambulatory Visit (INDEPENDENT_AMBULATORY_CARE_PROVIDER_SITE_OTHER): Payer: Managed Care, Other (non HMO) | Admitting: Obstetrics and Gynecology

## 2016-04-08 ENCOUNTER — Encounter: Payer: Self-pay | Admitting: Obstetrics and Gynecology

## 2016-04-08 ENCOUNTER — Telehealth: Payer: Self-pay | Admitting: Obstetrics and Gynecology

## 2016-04-08 VITALS — BP 100/60 | HR 114 | Wt 144.0 lb

## 2016-04-08 DIAGNOSIS — Z3A17 17 weeks gestation of pregnancy: Secondary | ICD-10-CM

## 2016-04-08 DIAGNOSIS — Z331 Pregnant state, incidental: Secondary | ICD-10-CM

## 2016-04-08 DIAGNOSIS — Z1389 Encounter for screening for other disorder: Secondary | ICD-10-CM

## 2016-04-08 DIAGNOSIS — Z369 Encounter for antenatal screening, unspecified: Secondary | ICD-10-CM

## 2016-04-08 DIAGNOSIS — Z3402 Encounter for supervision of normal first pregnancy, second trimester: Secondary | ICD-10-CM

## 2016-04-08 LAB — POCT URINALYSIS DIPSTICK
Blood, UA: NEGATIVE
Glucose, UA: NEGATIVE
KETONES UA: NEGATIVE
LEUKOCYTES UA: NEGATIVE
NITRITE UA: NEGATIVE
PROTEIN UA: NEGATIVE

## 2016-04-08 NOTE — Progress Notes (Signed)
Pt states that st times she has a sharp pain on her lower right side.

## 2016-04-08 NOTE — Progress Notes (Signed)
Patient ID: Amanda SavoyKierra D Mcmahon, female   DOB: 16-Apr-1996, 20 y.o.   MRN: [id number]  G1P0 3922w2d Estimated Date of Delivery: 09/21/16  Blood pressure 100/60, pulse 114, weight 144 lb (65.318 kg).   refer to the ob flow sheet for FH and FHR, also BP, Wt, Urine results:notable for negative  Patient reports too early for fetal movement, denies any bleeding and no rupture of membranes symptoms or regular contractions. Patient complaints: None. She reports no nausea.   FHR: 155 bpm FH: n/a  Questions were answered. Assessment: LROB G1P0 @ 2222w2d    Childbirth classes encouraged.  Plan:  Continued routine obstetrical care,   F/u in 4 weeks for pnx care, anatomy scan    By signing my name below, I, Ronney LionSuzanne Le, attest that this documentation has been prepared under the direction and in the presence of Amanda BurrowJohn Tegh Franek V, MD. Electronically Signed: Ronney LionSuzanne Le, ED Scribe. 04/08/2016. 10:12 AM.  I personally performed the services described in this documentation, which was SCRIBED in my presence. The recorded information has been reviewed and considered accurate. It has been edited as necessary during review. Amanda Mcmahon,Amanda Eriksson V, MD
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
## 2016-04-10 LAB — MATERNAL SCREEN, INTEGRATED #1
Crown Rump Length: 57 mm
Gest. Age on Collection Date: 12.3 weeks
Maternal Age at EDD: 22.7 years
Nuchal Translucency (NT): 1.2 mm
Number of Fetuses: 1
PAPP-A VALUE: 885.3 ng/mL
Weight: 144 [lb_av]

## 2016-04-11 LAB — MATERNAL SCREEN, INTEGRATED #2
ADSF: 0.92
AFP MARKER: 28.2 ng/mL
AFP MOM: 0.78
CROWN RUMP LENGTH: 57 mm
DIA MoM: 1.29
DIA Value: 232.1 pg/mL
Estriol, Unconjugated: 0.8 ng/mL
GEST. AGE ON COLLECTION DATE: 12.3 wk
GESTATIONAL AGE: 16.3 wk
HCG VALUE: 18.6 [IU]/mL
MATERNAL AGE AT EDD: 22.7 a
Nuchal Translucency (NT): 1.2 mm
Nuchal Translucency MoM: 0.81
Number of Fetuses: 1
PAPP-A MOM: 0.96
PAPP-A VALUE: 885.3 ng/mL
TEST RESULTS: NEGATIVE
WEIGHT: 144 [lb_av]
Weight: 144 [lb_av]
hCG MoM: 0.54

## 2016-04-14 ENCOUNTER — Encounter: Payer: Self-pay | Admitting: Obstetrics & Gynecology

## 2016-04-14 ENCOUNTER — Ambulatory Visit (INDEPENDENT_AMBULATORY_CARE_PROVIDER_SITE_OTHER): Payer: Managed Care, Other (non HMO) | Admitting: Obstetrics & Gynecology

## 2016-04-14 VITALS — BP 100/60 | HR 74 | Wt 146.0 lb

## 2016-04-14 DIAGNOSIS — Z3402 Encounter for supervision of normal first pregnancy, second trimester: Secondary | ICD-10-CM

## 2016-04-14 DIAGNOSIS — Z1389 Encounter for screening for other disorder: Secondary | ICD-10-CM

## 2016-04-14 DIAGNOSIS — Z369 Encounter for antenatal screening, unspecified: Secondary | ICD-10-CM

## 2016-04-14 DIAGNOSIS — Z3A18 18 weeks gestation of pregnancy: Secondary | ICD-10-CM

## 2016-04-14 DIAGNOSIS — Z331 Pregnant state, incidental: Secondary | ICD-10-CM

## 2016-04-14 LAB — POCT URINALYSIS DIPSTICK
Glucose, UA: NEGATIVE
Ketones, UA: NEGATIVE
LEUKOCYTES UA: NEGATIVE
NITRITE UA: NEGATIVE
Protein, UA: NEGATIVE
RBC UA: NEGATIVE

## 2016-04-14 NOTE — Addendum Note (Signed)
Addended by: Criss AlvinePULLIAM, Aydon Swamy G on: 04/14/2016 09:44 AM   Modules accepted: Orders

## 2016-04-14 NOTE — Progress Notes (Signed)
G1P0 5564w1d Estimated Date of Delivery: 09/21/16  Blood pressure 100/60, pulse 74, weight 146 lb (66.225 kg).   BP weight and urine results all reviewed and noted.  Please refer to the obstetrical flow sheet for the fundal height and fetal heart rate documentation:  Patient reports good fetal movement, denies any bleeding and no rupture of membranes symptoms or regular contractions. Patient is without complaints. All questions were answered.  Orders Placed This Encounter  Procedures  . Maternal Screen, Integrated #2  . POCT urinalysis dipstick    Plan:  Continued routine obstetrical care, 2nd IT  No Follow-up on file.

## 2016-04-16 ENCOUNTER — Ambulatory Visit (INDEPENDENT_AMBULATORY_CARE_PROVIDER_SITE_OTHER): Payer: Managed Care, Other (non HMO) | Admitting: Orthopaedic Surgery

## 2016-04-16 ENCOUNTER — Encounter: Payer: Self-pay | Admitting: Obstetrics & Gynecology

## 2016-04-16 ENCOUNTER — Encounter: Payer: Self-pay | Admitting: *Deleted

## 2016-04-16 ENCOUNTER — Encounter: Payer: Self-pay | Admitting: Orthopaedic Surgery

## 2016-04-16 VITALS — BP 105/68 | HR 97 | Temp 98.1°F | Resp 16 | Ht 63.0 in | Wt 144.0 lb

## 2016-04-16 DIAGNOSIS — M25561 Pain in right knee: Secondary | ICD-10-CM | POA: Diagnosis not present
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
## 2016-04-16 NOTE — Patient Instructions (Addendum)
Patient is pregnant and unable to take NSAIDS.  Precautions were discussed.  Instructed to use ice and elevate the leg as much as possible.  Exercise explained for the quadricepts muscles.

## 2016-04-16 NOTE — Progress Notes (Signed)
Patient ID: Carmine SavoyKierra D Mcmahon, female   DOB: October 31, 1996, 20 y.o.   MRN: [id number] Pt walked into the office today, states she was seen by Dr.Keeling this am for torn knee cartilage, was told to follow up with our office to what dosage of Advil is recommended at 17 w 3 d. Per [name], CNM, take per bottle direction but not take at 28 weeks, third trimester. Pt verbalized understanding.

## 2016-05-04 ENCOUNTER — Other Ambulatory Visit: Payer: Self-pay | Admitting: Obstetrics & Gynecology

## 2016-05-04 DIAGNOSIS — Z1389 Encounter for screening for other disorder: Secondary | ICD-10-CM

## 2016-05-06 ENCOUNTER — Ambulatory Visit (INDEPENDENT_AMBULATORY_CARE_PROVIDER_SITE_OTHER): Payer: Managed Care, Other (non HMO) | Admitting: Women's Health

## 2016-05-06 ENCOUNTER — Encounter: Payer: Self-pay | Admitting: Women's Health

## 2016-05-06 ENCOUNTER — Ambulatory Visit (INDEPENDENT_AMBULATORY_CARE_PROVIDER_SITE_OTHER): Payer: Managed Care, Other (non HMO)

## 2016-05-06 VITALS — BP 90/80 | HR 78 | Wt 147.0 lb

## 2016-05-06 DIAGNOSIS — Z36 Encounter for antenatal screening of mother: Secondary | ICD-10-CM

## 2016-05-06 DIAGNOSIS — Z3A21 21 weeks gestation of pregnancy: Secondary | ICD-10-CM | POA: Diagnosis not present

## 2016-05-06 DIAGNOSIS — Z1389 Encounter for screening for other disorder: Secondary | ICD-10-CM

## 2016-05-06 DIAGNOSIS — Z331 Pregnant state, incidental: Secondary | ICD-10-CM

## 2016-05-06 DIAGNOSIS — Z363 Encounter for antenatal screening for malformations: Secondary | ICD-10-CM

## 2016-05-06 DIAGNOSIS — Z3402 Encounter for supervision of normal first pregnancy, second trimester: Secondary | ICD-10-CM | POA: Diagnosis not present

## 2016-05-06 LAB — POCT URINALYSIS DIPSTICK
Glucose, UA: NEGATIVE
KETONES UA: NEGATIVE
LEUKOCYTES UA: NEGATIVE
NITRITE UA: NEGATIVE
PROTEIN UA: NEGATIVE
RBC UA: NEGATIVE

## 2016-05-06 NOTE — Progress Notes (Signed)
Low-risk OB appointment G1P0 7431w2d Estimated Date of Delivery: 09/21/16 BP 90/80 mmHg  Pulse 78  Wt 147 lb (66.679 kg)  LMP  (LMP Unknown)  BP, weight, and urine reviewed.  Refer to obstetrical flow sheet for FH & FHR.  Reports good fm.  Denies regular uc's, lof, vb, or uti s/s. No complaints. Reviewed today's normal anatomy u/s, warning s/s to report. Plan:  Continue routine obstetrical care  F/U in 4wks for OB appointment

## 2016-05-06 NOTE — Patient Instructions (Signed)

## 2016-05-06 NOTE — Progress Notes (Signed)
US 20+2 wks,cephalic,post pl gr 0,normal ov's bilat,svp of fluid, cx 3 cm,fhr 155 bpm,efw 324 g,anatomy complete,no obvious abnormalities seen

## 2016-05-14 ENCOUNTER — Ambulatory Visit: Payer: Managed Care, Other (non HMO) | Admitting: Orthopaedic Surgery

## 2016-06-03 ENCOUNTER — Ambulatory Visit (INDEPENDENT_AMBULATORY_CARE_PROVIDER_SITE_OTHER): Payer: Managed Care, Other (non HMO) | Admitting: Advanced Practice Midwife

## 2016-06-03 VITALS — BP 100/72 | HR 92 | Wt 152.0 lb

## 2016-06-03 DIAGNOSIS — Z1389 Encounter for screening for other disorder: Secondary | ICD-10-CM

## 2016-06-03 DIAGNOSIS — Z331 Pregnant state, incidental: Secondary | ICD-10-CM

## 2016-06-03 DIAGNOSIS — Z3402 Encounter for supervision of normal first pregnancy, second trimester: Secondary | ICD-10-CM

## 2016-06-03 DIAGNOSIS — Z3A24 24 weeks gestation of pregnancy: Secondary | ICD-10-CM

## 2016-06-03 LAB — POCT URINALYSIS DIPSTICK
Blood, UA: NEGATIVE
Glucose, UA: NEGATIVE
KETONES UA: NEGATIVE
Nitrite, UA: NEGATIVE

## 2016-06-03 NOTE — Patient Instructions (Signed)

## 2016-06-03 NOTE — Progress Notes (Signed)
G1P0 [redacted]w[redacted]d Estimated Date of Delivery: 09/21/16  Blood pressure 100/72, pulse 92, weight 152 lb (68.9 kg).   BP weight and urine results all reviewed and noted.  Please refer to the obstetrical flow sheet for the fundal height and fetal heart rate documentation:  Patient reports good fetal movement, denies any bleeding and no rupture of membranes symptoms or regular contractions. Patient is without complaints. All questions were answered.  Orders Placed This Encounter  Procedures  . POCT urinalysis dipstick    Plan:  Continued routine obstetrical care,   Return in about 3 weeks (around 06/24/2016) for PN2/LROB.

## 2016-06-25 ENCOUNTER — Ambulatory Visit (INDEPENDENT_AMBULATORY_CARE_PROVIDER_SITE_OTHER): Payer: Medicaid Other | Admitting: Obstetrics and Gynecology

## 2016-06-25 ENCOUNTER — Other Ambulatory Visit: Payer: Managed Care, Other (non HMO)

## 2016-06-25 ENCOUNTER — Encounter: Payer: Self-pay | Admitting: Obstetrics and Gynecology

## 2016-06-25 VITALS — BP 120/78 | HR 80 | Wt 159.0 lb

## 2016-06-25 DIAGNOSIS — Z331 Pregnant state, incidental: Secondary | ICD-10-CM

## 2016-06-25 DIAGNOSIS — Z1389 Encounter for screening for other disorder: Secondary | ICD-10-CM

## 2016-06-25 DIAGNOSIS — Z36 Encounter for antenatal screening of mother: Secondary | ICD-10-CM | POA: Diagnosis not present

## 2016-06-25 DIAGNOSIS — Z131 Encounter for screening for diabetes mellitus: Secondary | ICD-10-CM | POA: Diagnosis not present

## 2016-06-25 DIAGNOSIS — Z3402 Encounter for supervision of normal first pregnancy, second trimester: Secondary | ICD-10-CM

## 2016-06-25 DIAGNOSIS — Z369 Encounter for antenatal screening, unspecified: Secondary | ICD-10-CM

## 2016-06-25 LAB — POCT URINALYSIS DIPSTICK
GLUCOSE UA: NEGATIVE
KETONES UA: NEGATIVE
LEUKOCYTES UA: NEGATIVE
NITRITE UA: NEGATIVE
Protein, UA: NEGATIVE
RBC UA: NEGATIVE

## 2016-06-25 NOTE — Progress Notes (Signed)
G1P0  Estimated Date of Delivery: 09/21/16 LROB 3543w3d  Blood pressure 120/78, pulse 80, weight 159 lb (72.1 kg).   Urine results:notable for not able to void  Chief Complaint  Patient presents with  . Routine Prenatal Visit    Patient complaints:none, .well informed, ready for delivery. Living together x 4 yr Patient reports   good fetal movement,                           denies any bleeding , rupture of membranes,or regular contractions.   refer to the ob flow sheet for FH, 26 cm and FHR, ,   144 bpm                       Physical Examination: General appearance - alert, well appearing, and in no distress and normal appearing weight                                                                              Questions were answered. Assessment: LROB G1P0 @ 3643w3d  Informed , educated couple  Plan:  Continued routine obstetrical care,             PN2 today            Classes encouraged             F/u in 4 weeks for lrob

## 2016-06-26 LAB — GLUCOSE TOLERANCE, 2 HOURS W/ 1HR
GLUCOSE, FASTING: 77 mg/dL (ref 65–91)
Glucose, 1 hour: 125 mg/dL (ref 65–179)
Glucose, 2 hour: 117 mg/dL (ref 65–152)

## 2016-06-26 LAB — CBC
Hematocrit: 38.4 % (ref 34.0–46.6)
Hemoglobin: 13.1 g/dL (ref 11.1–15.9)
MCH: 31.3 pg (ref 26.6–33.0)
MCHC: 34.1 g/dL (ref 31.5–35.7)
MCV: 92 fL (ref 79–97)
PLATELETS: 256 10*3/uL (ref 150–379)
RBC: 4.18 x10E6/uL (ref 3.77–5.28)
RDW: 13.8 % (ref 12.3–15.4)
WBC: 14.1 10*3/uL — AB (ref 3.4–10.8)

## 2016-06-26 LAB — HIV ANTIBODY (ROUTINE TESTING W REFLEX): HIV SCREEN 4TH GENERATION: NONREACTIVE

## 2016-06-26 LAB — ANTIBODY SCREEN: Antibody Screen: NEGATIVE

## 2016-06-26 LAB — RPR: RPR Ser Ql: NONREACTIVE

## 2016-07-07 ENCOUNTER — Telehealth: Payer: Self-pay | Admitting: Women's Health

## 2016-07-07 NOTE — Telephone Encounter (Signed)
Pt informed of normal GTT. 

## 2016-07-28 ENCOUNTER — Encounter: Payer: Medicaid Other | Admitting: Women's Health

## 2016-07-29 ENCOUNTER — Encounter: Payer: Self-pay | Admitting: Women's Health

## 2016-07-29 ENCOUNTER — Ambulatory Visit (INDEPENDENT_AMBULATORY_CARE_PROVIDER_SITE_OTHER): Payer: Medicaid Other | Admitting: Women's Health

## 2016-07-29 VITALS — BP 114/62 | HR 72 | Wt 164.0 lb

## 2016-07-29 DIAGNOSIS — Z331 Pregnant state, incidental: Secondary | ICD-10-CM

## 2016-07-29 DIAGNOSIS — Z1389 Encounter for screening for other disorder: Secondary | ICD-10-CM

## 2016-07-29 DIAGNOSIS — Z23 Encounter for immunization: Secondary | ICD-10-CM

## 2016-07-29 DIAGNOSIS — Z3403 Encounter for supervision of normal first pregnancy, third trimester: Secondary | ICD-10-CM

## 2016-07-29 LAB — POCT URINALYSIS DIPSTICK
GLUCOSE UA: NEGATIVE
KETONES UA: NEGATIVE
Nitrite, UA: NEGATIVE
Protein, UA: NEGATIVE
RBC UA: NEGATIVE

## 2016-07-29 NOTE — Progress Notes (Signed)
Low-risk OB appointment G1P0 2822w2d Estimated Date of Delivery: 09/21/16 BP 114/62   Pulse 72   Wt 164 lb (74.4 kg)   LMP  (LMP Unknown)   BMI 29.05 kg/m   BP, weight, and urine reviewed.  Refer to obstetrical flow sheet for FH & FHR.  Reports good fm.  Denies regular uc's, lof, vb, or uti s/s. No complaints. Reviewed ptl s/s, fkc, normal pn2 results. Recommended Tdap at HD/PCP per CDC guidelines.  Plan:  Continue routine obstetrical care  F/U in 2wks for OB appointment  Flu shot today

## 2016-07-29 NOTE — Patient Instructions (Signed)
Call the office ([phone]) or go to [facility] if:  You begin to have strong, frequent contractions  Your water breaks.  Sometimes it is a big gush of fluid, sometimes it is just a trickle that keeps getting your panties wet or running down your legs  You have vaginal bleeding.  It is normal to have a small amount of spotting if your cervix was checked.   You don't feel your baby moving like normal.  If you don't, get you something to eat and drink and lay down and focus on feeling your baby move.  You should feel at least 10 movements in 2 hours.  If you don't, you should call the office or go to [facility].   Tdap Vaccine  It is recommended that you get the Tdap vaccine during the third trimester of EACH pregnancy to help protect your baby from getting pertussis (whooping cough)  27-36 weeks is the BEST time to do this so that you can pass the protection on to your baby. During pregnancy is better than after pregnancy, but if you are unable to get it during pregnancy it will be offered at the hospital.   You can get this vaccine at the health department or your family doctor  Everyone who will be around your baby should also be up-to-date on their vaccines. Adults (who are not pregnant) only need 1 dose of Tdap during adulthood.       Preterm Labor Information Preterm labor is when labor starts at less than 37 weeks of pregnancy. The normal length of a pregnancy is 39 to 41 weeks. CAUSES Often, there is no identifiable underlying cause as to why a woman goes into preterm labor. One of the most common known causes of preterm labor is infection. Infections of the uterus, cervix, vagina, amniotic sac, bladder, kidney, or even the lungs (pneumonia) can cause labor to start. Other suspected causes of preterm labor include:   Urogenital infections, such as yeast infections and bacterial vaginosis.   Uterine abnormalities (uterine shape, uterine septum, fibroids, or bleeding from  the placenta).   A cervix that has been operated on (it may fail to stay closed).   Malformations in the fetus.   Multiple gestations (twins, triplets, and so on).   Breakage of the amniotic sac.  RISK FACTORS  Having a previous history of preterm labor.   Having premature rupture of membranes (PROM).   Having a placenta that covers the opening of the cervix (placenta previa).   Having a placenta that separates from the uterus (placental abruption).   Having a cervix that is too weak to hold the fetus in the uterus (incompetent cervix).   Having too much fluid in the amniotic sac (polyhydramnios).   Taking illegal drugs or smoking while pregnant.   Not gaining enough weight while pregnant.   Being younger than 18 and older than 22 years old.   Having a low socioeconomic status.   Being African American. SYMPTOMS Signs and symptoms of preterm labor include:   Menstrual-like cramps, abdominal pain, or back pain.  Uterine contractions that are regular, as frequent as six in an hour, regardless of their intensity (may be mild or painful).  Contractions that start on the top of the uterus and spread down to the lower abdomen and back.   A sense of increased pelvic pressure.   A watery or bloody mucus discharge that comes from the vagina.  TREATMENT Depending on the length of the pregnancy and   other circumstances, your health care provider may suggest bed rest. If necessary, there are medicines that can be given to stop contractions and to mature the fetal lungs. If labor happens before 34 weeks of pregnancy, a prolonged hospital stay may be recommended. Treatment depends on the condition of both you and the fetus.  WHAT SHOULD YOU DO IF YOU THINK YOU ARE IN PRETERM LABOR? Call your health care provider right away. You will need to go to the hospital to get checked immediately. HOW CAN YOU PREVENT PRETERM LABOR IN FUTURE PREGNANCIES? You should:   Stop  smoking if you smoke.  Maintain healthy weight gain and avoid chemicals and drugs that are not necessary.  Be watchful for any type of infection.  Inform your health care provider if you have a known history of preterm labor.   This information is not intended to replace advice given to you by your health care provider. Make sure you discuss any questions you have with your health care provider.   Document Released: 01/09/2004 Document Revised: 06/21/2013 Document Reviewed: 11/21/2012 Elsevier Interactive Patient Education 2016 Elsevier Inc.  

## 2016-07-31 ENCOUNTER — Encounter: Payer: Medicaid Other | Admitting: Obstetrics and Gynecology

## 2016-08-11 ENCOUNTER — Encounter: Payer: Self-pay | Admitting: Advanced Practice Midwife

## 2016-08-11 ENCOUNTER — Ambulatory Visit (INDEPENDENT_AMBULATORY_CARE_PROVIDER_SITE_OTHER): Payer: Medicaid Other | Admitting: Advanced Practice Midwife

## 2016-08-11 VITALS — BP 102/72 | HR 84 | Wt 164.0 lb

## 2016-08-11 DIAGNOSIS — Z1389 Encounter for screening for other disorder: Secondary | ICD-10-CM

## 2016-08-11 DIAGNOSIS — Z331 Pregnant state, incidental: Secondary | ICD-10-CM

## 2016-08-11 DIAGNOSIS — Z3403 Encounter for supervision of normal first pregnancy, third trimester: Secondary | ICD-10-CM

## 2016-08-11 LAB — POCT URINALYSIS DIPSTICK
Blood, UA: NEGATIVE
GLUCOSE UA: NEGATIVE
Ketones, UA: NEGATIVE
NITRITE UA: NEGATIVE

## 2016-08-11 NOTE — Patient Instructions (Signed)

## 2016-08-11 NOTE — Progress Notes (Signed)
G1P0 8526w1d Estimated Date of Delivery: 09/21/16  Blood pressure 102/72, pulse 84, weight 164 lb (74.4 kg).   BP weight and urine results all reviewed and noted.  Please refer to the obstetrical flow sheet for the fundal height and fetal heart rate documentation:  Patient reports good fetal movement, denies any bleeding and no rupture of membranes symptoms or regular contractions. Patient is without complaints. All questions were answered.  Orders Placed This Encounter  Procedures  . POCT urinalysis dipstick    Plan:  Continued routine obstetrical care,   Return in about 2 weeks (around 08/25/2016) for LROB.

## 2016-08-25 ENCOUNTER — Encounter: Payer: Self-pay | Admitting: Advanced Practice Midwife

## 2016-08-25 ENCOUNTER — Ambulatory Visit (INDEPENDENT_AMBULATORY_CARE_PROVIDER_SITE_OTHER): Payer: Medicaid Other | Admitting: Advanced Practice Midwife

## 2016-08-25 VITALS — BP 99/68 | HR 88 | Wt 169.0 lb

## 2016-08-25 DIAGNOSIS — Z1389 Encounter for screening for other disorder: Secondary | ICD-10-CM

## 2016-08-25 DIAGNOSIS — Z331 Pregnant state, incidental: Secondary | ICD-10-CM

## 2016-08-25 DIAGNOSIS — Z3A36 36 weeks gestation of pregnancy: Secondary | ICD-10-CM

## 2016-08-25 DIAGNOSIS — Z3403 Encounter for supervision of normal first pregnancy, third trimester: Secondary | ICD-10-CM

## 2016-08-25 LAB — POCT URINALYSIS DIPSTICK
Blood, UA: NEGATIVE
GLUCOSE UA: NEGATIVE
KETONES UA: NEGATIVE
Nitrite, UA: NEGATIVE
Protein, UA: NEGATIVE

## 2016-08-25 NOTE — Progress Notes (Signed)
G1P0 138w1d Estimated Date of Delivery: 09/21/16  Blood pressure 99/68, pulse 88, weight 169 lb (76.7 kg).   BP weight and urine results all reviewed and noted.  Please refer to the obstetrical flow sheet for the fundal height and fetal heart rate documentation:  Patient reports good fetal movement, denies any bleeding and no rupture of membranes symptoms or regular contractions. Patient is without complaints. All questions were answered.  Orders Placed This Encounter  Procedures  . POCT urinalysis dipstick    Plan:  Continued routine obstetrical care,   Return in about 1 week (around 09/01/2016) for LROB.

## 2016-09-01 ENCOUNTER — Ambulatory Visit (INDEPENDENT_AMBULATORY_CARE_PROVIDER_SITE_OTHER): Payer: Medicaid Other

## 2016-09-01 ENCOUNTER — Encounter: Payer: Self-pay | Admitting: Women's Health

## 2016-09-01 ENCOUNTER — Ambulatory Visit (INDEPENDENT_AMBULATORY_CARE_PROVIDER_SITE_OTHER): Payer: Medicaid Other | Admitting: Women's Health

## 2016-09-01 VITALS — BP 108/60 | HR 84 | Wt 172.0 lb

## 2016-09-01 DIAGNOSIS — Z3A37 37 weeks gestation of pregnancy: Secondary | ICD-10-CM

## 2016-09-01 DIAGNOSIS — Z1389 Encounter for screening for other disorder: Secondary | ICD-10-CM | POA: Diagnosis not present

## 2016-09-01 DIAGNOSIS — Z3403 Encounter for supervision of normal first pregnancy, third trimester: Secondary | ICD-10-CM | POA: Diagnosis not present

## 2016-09-01 DIAGNOSIS — Z118 Encounter for screening for other infectious and parasitic diseases: Secondary | ICD-10-CM

## 2016-09-01 DIAGNOSIS — Z1159 Encounter for screening for other viral diseases: Secondary | ICD-10-CM

## 2016-09-01 DIAGNOSIS — O26843 Uterine size-date discrepancy, third trimester: Secondary | ICD-10-CM

## 2016-09-01 DIAGNOSIS — Z331 Pregnant state, incidental: Secondary | ICD-10-CM | POA: Diagnosis not present

## 2016-09-01 LAB — POCT URINALYSIS DIPSTICK
Glucose, UA: NEGATIVE
Ketones, UA: NEGATIVE
NITRITE UA: NEGATIVE

## 2016-09-01 NOTE — Patient Instructions (Signed)
Call the office ([phone]) or go to [facility] if:  You begin to have strong, frequent contractions  Your water breaks.  Sometimes it is a big gush of fluid, sometimes it is just a trickle that keeps getting your panties wet or running down your legs  You have vaginal bleeding.  It is normal to have a small amount of spotting if your cervix was checked.   You don't feel your baby moving like normal.  If you don't, get you something to eat and drink and lay down and focus on feeling your baby move.  You should feel at least 10 movements in 2 hours.  If you don't, you should call the office or go to [facility].    Braxton Hicks Contractions Contractions of the uterus can occur throughout pregnancy. Contractions are not always a sign that you are in labor.  WHAT ARE BRAXTON HICKS CONTRACTIONS?  Contractions that occur before labor are called Braxton Hicks contractions, or false labor. Toward the end of pregnancy (32-34 weeks), these contractions can develop more often and may become more forceful. This is not true labor because these contractions do not result in opening (dilatation) and thinning of the cervix. They are sometimes difficult to tell apart from true labor because these contractions can be forceful and people have different pain tolerances. You should not feel embarrassed if you go to the hospital with false labor. Sometimes, the only way to tell if you are in true labor is for your health care provider to look for changes in the cervix. If there are no prenatal problems or other health problems associated with the pregnancy, it is completely safe to be sent home with false labor and await the onset of true labor. HOW CAN YOU TELL THE DIFFERENCE BETWEEN TRUE AND FALSE LABOR? False Labor  The contractions of false labor are usually shorter and not as hard as those of true labor.   The contractions are usually irregular.   The contractions are often felt in the front of  the lower abdomen and in the groin.   The contractions may go away when you walk around or change positions while lying down.   The contractions get weaker and are shorter lasting as time goes on.   The contractions do not usually become progressively stronger, regular, and closer together as with true labor.  True Labor  Contractions in true labor last 30-70 seconds, become very regular, usually become more intense, and increase in frequency.   The contractions do not go away with walking.   The discomfort is usually felt in the top of the uterus and spreads to the lower abdomen and low back.   True labor can be determined by your health care provider with an exam. This will show that the cervix is dilating and getting thinner.  WHAT TO REMEMBER  Keep up with your usual exercises and follow other instructions given by your health care provider.   Take medicines as directed by your health care provider.   Keep your regular prenatal appointments.   Eat and drink lightly if you think you are going into labor.   If Braxton Hicks contractions are making you uncomfortable:   Change your position from lying down or resting to walking, or from walking to resting.   Sit and rest in a tub of warm water.   Drink 2-3 glasses of water. Dehydration may cause these contractions.   Do slow and deep breathing several times an hour.    WHEN SHOULD I SEEK IMMEDIATE MEDICAL CARE? Seek immediate medical care if:  Your contractions become stronger, more regular, and closer together.   You have fluid leaking or gushing from your vagina.   You have a fever.   You pass blood-tinged mucus.   You have vaginal bleeding.   You have continuous abdominal pain.   You have low back pain that you never had before.   You feel your baby's head pushing down and causing pelvic pressure.   Your baby is not moving as much as it used to.    This information is not intended to  replace advice given to you by your health care provider. Make sure you discuss any questions you have with your health care provider.   Document Released: 10/19/2005 Document Revised: 10/24/2013 Document Reviewed: 07/31/2013 Elsevier Interactive Patient Education 2016 [employer]  

## 2016-09-01 NOTE — Progress Notes (Signed)
US 37+1 wks,cephalic,fhr 148 bpm,bilat adnexa's wnl,afi 10.7 cm,post pl gr 3,EFW 2723 g,21% Hadlock,BPD <2.3%,HC 3.8%,AC 12%,CI 73% (70-86%)

## 2016-09-01 NOTE — Progress Notes (Signed)
Low-risk OB appointment G1P0 4541w1d Estimated Date of Delivery: 09/21/16 BP 108/60   Pulse 84   Wt 172 lb (78 kg)   LMP  (LMP Unknown)   BMI 30.47 kg/m   BP, weight, and urine reviewed.  Refer to obstetrical flow sheet for FH & FHR.  Reports good fm.  Denies regular uc's, lof, vb, or uti s/s. No complaints. GBS, gc/ct collected SVE per request: 1.5/50/-2, vtx Reviewed labor s/s, fkc. Plan:  Continue routine obstetrical care  F/U asap for efw/afi u/s for s<d (no visit), then 1wk for OB appointment

## 2016-09-03 LAB — GC/CHLAMYDIA PROBE AMP
CHLAMYDIA, DNA PROBE: NEGATIVE
NEISSERIA GONORRHOEAE BY PCR: NEGATIVE

## 2016-09-05 LAB — CULTURE, BETA STREP (GROUP B ONLY): Strep Gp B Culture: NEGATIVE

## 2016-09-10 ENCOUNTER — Encounter: Payer: Self-pay | Admitting: Obstetrics & Gynecology

## 2016-09-10 ENCOUNTER — Ambulatory Visit (INDEPENDENT_AMBULATORY_CARE_PROVIDER_SITE_OTHER): Payer: Medicaid Other | Admitting: Obstetrics & Gynecology

## 2016-09-10 VITALS — BP 128/60 | HR 94 | Wt 172.0 lb

## 2016-09-10 DIAGNOSIS — O0993 Supervision of high risk pregnancy, unspecified, third trimester: Secondary | ICD-10-CM

## 2016-09-10 DIAGNOSIS — Z1389 Encounter for screening for other disorder: Secondary | ICD-10-CM

## 2016-09-10 DIAGNOSIS — Z331 Pregnant state, incidental: Secondary | ICD-10-CM

## 2016-09-10 DIAGNOSIS — Z3A39 39 weeks gestation of pregnancy: Secondary | ICD-10-CM | POA: Diagnosis not present

## 2016-09-10 DIAGNOSIS — O365931 Maternal care for other known or suspected poor fetal growth, third trimester, fetus 1: Secondary | ICD-10-CM | POA: Diagnosis not present

## 2016-09-10 LAB — POCT URINALYSIS DIPSTICK
GLUCOSE UA: NEGATIVE
Ketones, UA: NEGATIVE
Leukocytes, UA: NEGATIVE
NITRITE UA: NEGATIVE
RBC UA: NEGATIVE

## 2016-09-10 NOTE — Progress Notes (Signed)
Fetal Surveillance Testing today:  FHR 154   High Risk Pregnancy Diagnosis(es):   Lagging growth, not technically IUGR with Head <3%, but low  G1P0 943w3d Estimated Date of Delivery: 09/21/16  Blood pressure 128/60, pulse 94, weight 172 lb (78 kg).  Urinalysis: Negative   HPI: The patient is being seen today for ongoing management of as above. Today she reports no problems   BP weight and urine results all reviewed and noted. Patient reports good fetal movement, denies any bleeding and no rupture of membranes symptoms or regular contractions.  Fundal Height:  34 Fetal Heart rate:  154 Edema:  none  Patient is without complaints other than noted in her HPI. All questions were answered.  All lab and sonogram results have been reviewed. Comments:    Assessment:  1.  Pregnancy at 8143w3d,  Estimated Date of Delivery: 09/21/16 :                          2.  Lagging growth with lagging HC                        3.    Medication(s) Plans:  none  Treatment Plan:  Induction 40 weeks if undelivered, will repeat scan next week  Return in about 1 week (around 09/17/2016) for BPP/sono, HROB, with Dr Despina HiddenEure. for appointment for high risk OB care  No orders of the defined types were placed in this encounter.  Orders Placed This Encounter  Procedures  . US OB Follow Up  . US UA Cord Doppler  . US Fetal BPP W/O Non Stress  . POCT urinalysis dipstick

## 2016-09-14 ENCOUNTER — Inpatient Hospital Stay (HOSPITAL_COMMUNITY)
Admission: AD | Admit: 2016-09-14 | Discharge: 2016-09-14 | Disposition: A | Discharge status: Home / Self Care | Payer: 59 | Source: Ambulatory Visit | Attending: Family Medicine | Admitting: Family Medicine

## 2016-09-14 DIAGNOSIS — Z3493 Encounter for supervision of normal pregnancy, unspecified, third trimester: Secondary | ICD-10-CM | POA: Insufficient documentation

## 2016-09-14 DIAGNOSIS — Z3A39 39 weeks gestation of pregnancy: Secondary | ICD-10-CM | POA: Insufficient documentation

## 2016-09-14 DIAGNOSIS — Z3403 Encounter for supervision of normal first pregnancy, third trimester: Secondary | ICD-10-CM

## 2016-09-14 MED ORDER — OXYCODONE-ACETAMINOPHEN 5-325 MG PO TABS
1.0000 | ORAL_TABLET | Freq: Once | ORAL | Status: AC
Start: 2016-09-14 — End: 2016-09-14
  Administered 2016-09-14: 1 via ORAL
  Filled 2016-09-14: qty 1

## 2016-09-14 NOTE — MAU Note (Signed)
Pt may ambulate for 1.5 hours and then recheck.

## 2016-09-14 NOTE — MAU Note (Signed)
Pt says she's having contractions about every 2 minutes, no bleeding or LOF

## 2016-09-16 ENCOUNTER — Telehealth: Payer: Self-pay | Admitting: Women's Health

## 2016-09-16 NOTE — Telephone Encounter (Signed)
[facility] called [facility] and wants to be checked now.She was 3cm. She is not contracting, bleeding or leaking fluid. The baby is active. Patient states she has an appt on Friday.   Spoke with Joyce CopaF. Gurkaran Rahm-dishmon, CNM who stated patient should keep her appt Friday. If she starts contracting regularly, leaking or bleeding to give us a call back or [facility]. Informed patient that if she was not contracting now, cervix was probably not dilated anymore.encouraged patient to stay hydrated and to rest.  Patient verbalized understanding.

## 2016-09-18 ENCOUNTER — Encounter: Payer: Self-pay | Admitting: Obstetrics & Gynecology

## 2016-09-18 ENCOUNTER — Ambulatory Visit (INDEPENDENT_AMBULATORY_CARE_PROVIDER_SITE_OTHER): Payer: Medicaid Other | Admitting: Obstetrics & Gynecology

## 2016-09-18 ENCOUNTER — Ambulatory Visit (INDEPENDENT_AMBULATORY_CARE_PROVIDER_SITE_OTHER): Payer: Medicaid Other

## 2016-09-18 VITALS — BP 130/80 | HR 84 | Wt 175.0 lb

## 2016-09-18 DIAGNOSIS — Z3403 Encounter for supervision of normal first pregnancy, third trimester: Secondary | ICD-10-CM

## 2016-09-18 DIAGNOSIS — O365931 Maternal care for other known or suspected poor fetal growth, third trimester, fetus 1: Secondary | ICD-10-CM

## 2016-09-18 DIAGNOSIS — O1203 Gestational edema, third trimester: Secondary | ICD-10-CM

## 2016-09-18 DIAGNOSIS — Z3A40 40 weeks gestation of pregnancy: Secondary | ICD-10-CM

## 2016-09-18 DIAGNOSIS — Z331 Pregnant state, incidental: Secondary | ICD-10-CM

## 2016-09-18 DIAGNOSIS — Z1389 Encounter for screening for other disorder: Secondary | ICD-10-CM

## 2016-09-18 LAB — POCT URINALYSIS DIPSTICK
GLUCOSE UA: NEGATIVE
Ketones, UA: NEGATIVE
Leukocytes, UA: NEGATIVE
NITRITE UA: NEGATIVE
Protein, UA: 1
RBC UA: NEGATIVE

## 2016-09-18 NOTE — Progress Notes (Signed)
Patient ID: Amanda Mcmahon, female   DOB: November 12, 1995, 20 y.o.   MRN: [id number] Fetal Surveillance Testing today:  BPP 8/8 with excellent fetal Doppler flow   High Risk Pregnancy Diagnosis(es):   Lagging head growth  G1P0 4117w4d Estimated Date of Delivery: 09/21/16  Blood pressure 130/80, pulse 84, weight 175 lb (79.4 kg).  Urinalysis: 1+ protein   HPI: The patient is being seen today for ongoing management of as above. Today she reports no problems   BP weight and urine results all reviewed and noted. Patient reports good fetal movement, denies any bleeding and no rupture of membranes symptoms or regular contractions.  Fundal Height:  34 Fetal Heart rate:  143 Edema:  none  Patient is without complaints other than noted in her HPI. All questions were answered.  All lab and sonogram results have been reviewed. Comments:    Assessment:  1.  Pregnancy at 6917w4d,  Estimated Date of Delivery: 09/21/16 :                          2.  Lagging AC/HC/BPD                        3.    Medication(s) Plans:    Treatment Plan:  Induction 09/22/2016@0630  am  Return in about 6 weeks (around 10/30/2016) for post partum visit. for appointment for high risk OB care  No orders of the defined types were placed in this encounter.  Orders Placed This Encounter  Procedures  . POCT urinalysis dipstick

## 2016-09-18 NOTE — Progress Notes (Signed)
US 39+4 wks,cephalic,fhr 135 bpm,normal ov's bilat,BPP 8/8,AFI 12.8 mm,RI .54,.57,post pl gr 3,EFW 3190 g 30.1% Williams,23% Hadlock,BPD <2.3%,HC <2.3%, AC 3.8%,Fl 43%

## 2016-09-18 NOTE — Treatment Plan (Signed)
Induction Assessment Scheduling Form: [facility]ax to [facility]:  [phone]  Amanda Mcmahon                                                                                   DOB:  1996/03/28                                                            MRN:  [id number]                                                                     Phone #:   [phone]                         Provider:  [facility]  GP:  G1P0                                                            Estimated Date of Delivery: 09/21/16  Dating Criteria: 8 weeks sonogram    Medical Indications for induction:  Lagging Head growth and AC progression Admission Date/Time:  09/22/2016@0630  Gestational age on admission:  [redacted]w[redacted]d   Filed Weights   09/18/16 1010  Weight: 175 lb (79.4 kg)   HIV:  Non Reactive (08/24 0918) GBS: negative    3.5 cm dilated, 50% effaeced, -2 station, soft midplane   Method of induction(proposed):  Foley/pitocin   Scheduling Provider Signature:  Lazaro Arms, MD          Scheduled with Juliann Pulse Date:  09/18/2016

## 2016-09-19 ENCOUNTER — Encounter (HOSPITAL_COMMUNITY): Payer: Self-pay | Admitting: Certified Nurse Midwife

## 2016-09-19 ENCOUNTER — Inpatient Hospital Stay (HOSPITAL_COMMUNITY)
Admission: AD | Admit: 2016-09-19 | Discharge: 2016-09-21 | DRG: 775 | Disposition: A | Discharge status: Home / Self Care | Payer: 59 | Source: Ambulatory Visit | Attending: Obstetrics & Gynecology | Admitting: Obstetrics & Gynecology

## 2016-09-19 DIAGNOSIS — Z833 Family history of diabetes mellitus: Secondary | ICD-10-CM | POA: Diagnosis not present

## 2016-09-19 DIAGNOSIS — Z3A39 39 weeks gestation of pregnancy: Secondary | ICD-10-CM

## 2016-09-19 DIAGNOSIS — Z823 Family history of stroke: Secondary | ICD-10-CM

## 2016-09-19 DIAGNOSIS — Z8249 Family history of ischemic heart disease and other diseases of the circulatory system: Secondary | ICD-10-CM

## 2016-09-19 DIAGNOSIS — Z3403 Encounter for supervision of normal first pregnancy, third trimester: Secondary | ICD-10-CM | POA: Diagnosis present

## 2016-09-19 LAB — CBC
HEMATOCRIT: 37.3 % (ref 36.0–46.0)
HEMOGLOBIN: 13.5 g/dL (ref 12.0–15.0)
MCH: 32.3 pg (ref 26.0–34.0)
MCHC: 36.2 g/dL — AB (ref 30.0–36.0)
MCV: 89.2 fL (ref 78.0–100.0)
Platelets: 248 10*3/uL (ref 150–400)
RBC: 4.18 MIL/uL (ref 3.87–5.11)
RDW: 13.3 % (ref 11.5–15.5)
WBC: 20 10*3/uL — ABNORMAL HIGH (ref 4.0–10.5)

## 2016-09-19 LAB — TYPE AND SCREEN
ABO/RH(D): O POS
Antibody Screen: NEGATIVE

## 2016-09-19 MED ORDER — OXYCODONE-ACETAMINOPHEN 5-325 MG PO TABS: 2.0000 | ORAL_TABLET | ORAL | Status: DC | PRN

## 2016-09-19 MED ORDER — SIMETHICONE 80 MG PO CHEW: 80.0000 mg | CHEWABLE_TABLET | ORAL | Status: DC | PRN

## 2016-09-19 MED ORDER — LIDOCAINE HCL (PF) 1 % IJ SOLN
30.0000 mL | INTRAMUSCULAR | Status: DC | PRN
  Administered 2016-09-19: 30 mL via SUBCUTANEOUS
  Filled 2016-09-19: qty 30

## 2016-09-19 MED ORDER — FENTANYL 2.5 MCG/ML BUPIVACAINE 1/10 % EPIDURAL INFUSION (WH - ANES): 14.0000 mL/h | INTRAMUSCULAR | Status: DC | PRN

## 2016-09-19 MED ORDER — FENTANYL CITRATE (PF) 100 MCG/2ML IJ SOLN
100.0000 ug | INTRAMUSCULAR | Status: DC | PRN
  Filled 2016-09-19: qty 2

## 2016-09-19 MED ORDER — LACTATED RINGERS IV SOLN: 500.0000 mL | INTRAVENOUS | Status: DC | PRN

## 2016-09-19 MED ORDER — EPHEDRINE 5 MG/ML INJ
10.0000 mg | INTRAVENOUS | Status: DC | PRN
  Filled 2016-09-19: qty 4

## 2016-09-19 MED ORDER — DIPHENHYDRAMINE HCL 25 MG PO CAPS: 25.0000 mg | ORAL_CAPSULE | Freq: Four times a day (QID) | ORAL | Status: DC | PRN

## 2016-09-19 MED ORDER — ACETAMINOPHEN 325 MG PO TABS: 650.0000 mg | ORAL_TABLET | ORAL | Status: DC | PRN

## 2016-09-19 MED ORDER — OXYTOCIN 40 UNITS IN LACTATED RINGERS INFUSION - SIMPLE MED
2.5000 [IU]/h | INTRAVENOUS | Status: DC
  Filled 2016-09-19: qty 1000

## 2016-09-19 MED ORDER — PRENATAL MULTIVITAMIN CH
1.0000 | ORAL_TABLET | Freq: Every day | ORAL | Status: DC
  Administered 2016-09-20 – 2016-09-21 (×2): 1 via ORAL
  Filled 2016-09-19 (×2): qty 1

## 2016-09-19 MED ORDER — ACETAMINOPHEN 325 MG PO TABS
650.0000 mg | ORAL_TABLET | ORAL | Status: DC | PRN
  Administered 2016-09-20: 650 mg via ORAL
  Filled 2016-09-19: qty 2

## 2016-09-19 MED ORDER — COCONUT OIL OIL: 1.0000 "application " | TOPICAL_OIL | Status: DC | PRN

## 2016-09-19 MED ORDER — ZOLPIDEM TARTRATE 5 MG PO TABS: 5.0000 mg | ORAL_TABLET | Freq: Every evening | ORAL | Status: DC | PRN

## 2016-09-19 MED ORDER — SOD CITRATE-CITRIC ACID 500-334 MG/5ML PO SOLN: 30.0000 mL | ORAL | Status: DC | PRN

## 2016-09-19 MED ORDER — LACTATED RINGERS IV SOLN
INTRAVENOUS | Status: DC
  Administered 2016-09-19: 16:00:00 via INTRAVENOUS

## 2016-09-19 MED ORDER — SENNOSIDES-DOCUSATE SODIUM 8.6-50 MG PO TABS
2.0000 | ORAL_TABLET | ORAL | Status: DC
  Administered 2016-09-20 (×2): 2 via ORAL
  Filled 2016-09-19 (×2): qty 2

## 2016-09-19 MED ORDER — FLEET ENEMA 7-19 GM/118ML RE ENEM: 1.0000 | ENEMA | RECTAL | Status: DC | PRN

## 2016-09-19 MED ORDER — ONDANSETRON HCL 4 MG PO TABS: 4.0000 mg | ORAL_TABLET | ORAL | Status: DC | PRN

## 2016-09-19 MED ORDER — ONDANSETRON HCL 4 MG/2ML IJ SOLN: 4.0000 mg | INTRAMUSCULAR | Status: DC | PRN

## 2016-09-19 MED ORDER — BENZOCAINE-MENTHOL 20-0.5 % EX AERO
1.0000 "application " | INHALATION_SPRAY | CUTANEOUS | Status: DC | PRN
  Filled 2016-09-19: qty 56

## 2016-09-19 MED ORDER — DIPHENHYDRAMINE HCL 50 MG/ML IJ SOLN: 12.5000 mg | INTRAMUSCULAR | Status: DC | PRN

## 2016-09-19 MED ORDER — WITCH HAZEL-GLYCERIN EX PADS: 1.0000 "application " | MEDICATED_PAD | CUTANEOUS | Status: DC | PRN

## 2016-09-19 MED ORDER — DIBUCAINE 1 % RE OINT: 1.0000 "application " | TOPICAL_OINTMENT | RECTAL | Status: DC | PRN

## 2016-09-19 MED ORDER — OXYCODONE-ACETAMINOPHEN 5-325 MG PO TABS: 1.0000 | ORAL_TABLET | ORAL | Status: DC | PRN

## 2016-09-19 MED ORDER — ONDANSETRON HCL 4 MG/2ML IJ SOLN: 4.0000 mg | Freq: Four times a day (QID) | INTRAMUSCULAR | Status: DC | PRN

## 2016-09-19 MED ORDER — OXYTOCIN BOLUS FROM INFUSION
500.0000 mL | Freq: Once | INTRAVENOUS | Status: AC
  Administered 2016-09-19: 500 mL via INTRAVENOUS

## 2016-09-19 MED ORDER — IBUPROFEN 600 MG PO TABS
600.0000 mg | ORAL_TABLET | Freq: Four times a day (QID) | ORAL | Status: DC
  Administered 2016-09-19 – 2016-09-21 (×6): 600 mg via ORAL
  Filled 2016-09-19 (×7): qty 1

## 2016-09-19 MED ORDER — PHENYLEPHRINE 40 MCG/ML (10ML) SYRINGE FOR IV PUSH (FOR BLOOD PRESSURE SUPPORT)
80.0000 ug | PREFILLED_SYRINGE | INTRAVENOUS | Status: DC | PRN
  Filled 2016-09-19: qty 5

## 2016-09-19 MED ORDER — LACTATED RINGERS IV SOLN: 500.0000 mL | Freq: Once | INTRAVENOUS | Status: DC

## 2016-09-19 MED ORDER — TETANUS-DIPHTH-ACELL PERTUSSIS 5-2.5-18.5 LF-MCG/0.5 IM SUSP: 0.5000 mL | Freq: Once | INTRAMUSCULAR | Status: DC

## 2016-09-19 MED ORDER — FENTANYL CITRATE (PF) 100 MCG/2ML IJ SOLN: 100.0000 ug | Freq: Once | INTRAMUSCULAR | Status: DC

## 2016-09-19 NOTE — Consult Note (Signed)
Neonatology Note:  Attendance at Code Apgar:   Our team responded to a Code Apgar call to room # 165 following NSVD, due to infant with apnea. The requesting physician was Dr. Genevie AnnSchenk. The mother is a G1, GBS neg with good PNC without complicates. Delivery complicated by decels just PTD. ROM occurred 2 hours PTD and the fluid was clear.  At delivery, the baby without tone or respiratory effort. The OB nursing staff in attendance gave vigorous stimulation and a Code Apgar was called. Our team arrived at <1 minutes of life, at which time the baby was receiving PPV and being dried and stimulated. HR >100 with occasional respirations noted.  PPV continued while pulse ox placed and equipment set up for CPAP.  Apparent fluid obstruction with failed attempts to clear airway by infant.  We bulb suctioned clear fluid which helped some and then deep suctioned down both nares and orophyarynx with ~9cc of clear amniotic fluid removed.  Infant's respiratory effort notably improved and lungs cleared with concurrent improvements in tone and grimace. CPAP removed and blow by oxygen given for another 5min until weaned off at ~6610min. Watched for another 5min and infant appeared comfortable and appropriate.   Ap 4/7/8.  I spoke with the parents in the DR, then transferred the baby to the Pediatrician's care. Staff agreeable to monitoring for continued proper transitioning.  Please do not hesitate in contacting us if further concerns.   Dineen Kidavid C. Leary RocaEhrmann, MD

## 2016-09-19 NOTE — MAU Note (Signed)
Pt states she is having ctxs every 4-5 minutes for the last 2 hours. Pt states she is having bloody show and mucous but denies LOF. Fetus is active.

## 2016-09-19 NOTE — H&P (Signed)
LABOR AND DELIVERY ADMISSION HISTORY AND PHYSICAL NOTE  Amanda Mcmahon is a 20 y.o. female G1P0 with IUP at 362w5d by U/S presenting for regular contractions. She reports having strong, regular contractions since 11:00 A.M. That lasted every 4-5 min. Currently, she is having contractions every 2-3 min. During the labor check, she had ROM with bloody show.   She reports positive fetal movement.   Prenatal History/Complications:  Past Medical History: Past Medical History:  Diagnosis Date  . Medical history non-contributory     Past Surgical History: Past Surgical History:  Procedure Laterality Date  . NO PAST SURGERIES      Obstetrical History: OB History    Gravida Para Term Preterm AB Living   1             SAB TAB Ectopic Multiple Live Births                  Social History: Social History   Social History  . Marital status: Single    Spouse name: N/A  . Number of children: N/A  . Years of education: N/A   Social History Main Topics  . Smoking status: Never Smoker  . Smokeless tobacco: Never Used  . Alcohol use No     Comment: in the past, now pregnant; not now  . Drug use: No  . Sexual activity: Yes    Birth control/ protection: None   Other Topics Concern  . None   Social History Narrative  . None    Family History: Family History  Problem Relation Age of Onset  . Arthritis Mother   . Asthma Maternal Grandmother   . Cancer Maternal Grandmother     lung  . COPD Maternal Grandmother   . Depression Maternal Grandmother   . Diabetes Maternal Grandmother   . Hypertension Maternal Grandmother   . Stroke Maternal Grandmother   . Hypertension Maternal Grandfather   . Diabetes Paternal Grandmother     Allergies: No Known Allergies  Prescriptions Prior to Admission  Medication Sig Dispense Refill Last Dose  . Prenat w/o A-[id number] (CITRANATAL ASSURE) 35-1 & 300 MG tablet One tablet and one capsule daily 60 tablet 11 Taking     Review  of Systems   All systems reviewed and negative except as stated in HPI  Blood pressure 131/86, pulse 95, temperature 97.6 F (36.4 C), temperature source Oral, resp. rate 20. General appearance: alert Lungs: clear to auscultation bilaterally Heart: regular rate and rhythm Abdomen: soft, non-tender; bowel sounds normal Extremities: No calf swelling or tenderness Presentation: cephalic Fetal monitoring: Baseline: 150 Variability: Moderate Accelerations: Reactive Decelerations: None Uterine activity: Every 2-3 min Dilation: 4.5 Effacement (%): 80 Station: -2 Exam by:: Dione PloverA. Jones RNC   Prenatal labs: ABO, Rh: O/Positive/-- (04/19 1211) Antibody: Negative (08/24 0918) Rubella: !Error! RPR: Non Reactive (08/24 0918)  HBsAg: Negative (04/19 1211)  HIV: Non Reactive (08/24 0918)  GBS:    2 hr Glucola: WNL Genetic screening: NT/IT: neg Anatomy US: Normal  Prenatal Transfer Tool  Maternal Diabetes: No Genetic Screening: Normal Maternal Ultrasounds/Referrals: Abnormal:  Findings:   Other: Lagging AC/HC/BPD Fetal Ultrasounds or other Referrals:  Other: Follow up sonogram for EFW, BPP, and cord doppler. Showed normal fluid volume, normal antepartum fetal assessment with BPP, normal fetal doppler ratios with consistent diastolic flow, lagging head growth and AC but overall ok EFW percentile Maternal Substance Abuse:  No Significant Maternal Medications:  None Significant Maternal Lab Results: None  No results found for  this or any previous visit (from the past 24 hour(s)).  Patient Active Problem List   Diagnosis Date Noted  . Supervision of normal first pregnancy 02/19/2016  . KNEE SPRAIN 07/28/2010    Assessment: Amanda Mcmahon is a 20 y.o. G1P0 at 5728w5d here for uterine contractions with SROM.   #labor: SROM with SOL.  #Pain: IV pain medication #FWB: Cat I #ID:  GBS neg #MOF: breast and bottle #MOC: depo  Ernestina PennaNicholas Blonnie Maske MD 09/19/2016, 3:48 PM

## 2016-09-20 LAB — ABO/RH: ABO/RH(D): O POS

## 2016-09-20 LAB — RPR: RPR: NONREACTIVE

## 2016-09-20 NOTE — Lactation Note (Signed)
This note was copied from a baby's chart. Lactation Consultation Note  Patient Name: Amanda Mcmahon NWGNF'AToday's Date: 09/20/2016 Reason for consult: Initial assessment Infant is 22 hours old & seen by Lactation for initial assessment. Baby was born at 844w5d & weighed 6lbs 10.4oz at birth. Baby was asleep in mom's arms when LC entered. Mom reports BF is going well (better on her right breast compared to her left breast) & that she is not having any discomfort. Mom reported last feeding was ~11:30am but tried again at 3pm but baby would not wake up to feed. Provided mom with BF booklet, BF resources, & feeding log; mom made aware of O/P services, breastfeeding support groups, community resources, and our phone # for post-discharge questions. Discussed frequency of feeds, importance of skin-to-skin, and encouraged mom to offer both breasts every feeding. Mom is covered on her mom's [employer] insurance so is interested in getting a Personal pump. Encouraged mom to go to gift shop on way out and bring her insurance card. Mom also has WIC- encouraged mom to call Monday to set up appointment.  Mom reports no questions at this time. Encouraged mom to ask for LC at future feeding to assess latch.  Maternal Data    Feeding Feeding Type: Breast Fed  LATCH Score/Interventions                      Lactation Tools Discussed/Used WIC Program: Yes   Consult Status Consult Status: Follow-up Date: 09/21/16 Follow-up type: In-patient    Oneal GroutLaura C Kaniel Kiang 09/20/2016, 4:19 PM

## 2016-09-20 NOTE — Progress Notes (Signed)
POSTPARTUM PROGRESS NOTE  Post Partum Day 1 Subjective:  Amanda Mcmahon is a 20 y.o. G1P1001 4676w5d s/p SVD.  No acute events overnight.  Pt denies problems with ambulating, voiding or po intake.  She denies nausea or vomiting.  Pain is moderately controlled.  She has had flatus. She has not had bowel movement.  Lochia Small.   Objective: Blood pressure 120/71, pulse 82, temperature 98.1 F (36.7 C), temperature source Oral, resp. rate 18, height 5\' 3"  (1.6 m), weight 172 lb (78 kg), SpO2 100 %, unknown if currently breastfeeding.  Physical Exam:  General: alert, cooperative and no distress Lochia:normal flow Chest: no respiratory distress Heart:regular rate, distal pulses intact Abdomen: soft, nontender,  Uterine Fundus: firm, appropriately tender DVT Evaluation: No calf swelling or tenderness Extremities: trace edema   Recent Labs  09/19/16 1843  HGB 13.5  HCT 37.3    Assessment/Plan:  ASSESSMENT: Amanda SavoyKierra D Mooradian is a 20 y.o. G1P1001 4876w5d s/p SVD  Plan for discharge tomorrow, Breastfeeding and Contraception depo-provera   LOS: 1 day   Les Pouicholas SchenkMD 09/20/2016, 6:53 AM

## 2016-09-21 MED ORDER — IBUPROFEN 600 MG PO TABS: 600.0000 mg | ORAL_TABLET | Freq: Four times a day (QID) | ORAL | 2 refills | Status: DC

## 2016-09-21 MED ORDER — MENTHOL 3 MG MT LOZG
1.0000 | LOZENGE | OROMUCOSAL | Status: DC | PRN
  Administered 2016-09-21: 3 mg via ORAL

## 2016-09-21 NOTE — Lactation Note (Signed)
This note was copied from a baby's chart. Lactation Consultation Note  Mother states she has been using NS on L side only to help latch. Mother states baby recently received 10 ml bm via syringe since she would not wake to latch. Reviewed waking techniques. Undressed baby for feeding. Attempted latching in football hold but baby did not wake to feed. Mother has UMR DEBP.  Encouraged continuing to post pump and give baby volume. Mom encouraged to feed baby 8-12 times/24 hours and with feeding cues.  Reviewed engorgement care and monitoring voids/stools.   Patient Name: Amanda Mcmahon EAVWU'JToday's Date: 09/21/2016 Reason for consult: Follow-up assessment   Maternal Data    Feeding Feeding Type: Breast Fed Length of feed: 0 min  LATCH Score/Interventions Latch: Too sleepy or reluctant, no latch achieved, no sucking elicited. Intervention(s): Skin to skin;Teach feeding cues;Waking techniques  Audible Swallowing: None Intervention(s): Skin to skin;Hand expression  Type of Nipple: Everted at rest and after stimulation  Comfort (Breast/Nipple): Filling, red/small blisters or bruises, mild/mod discomfort  Problem noted: Filling;Mild/Moderate discomfort Interventions (Filling): Massage;Hand pump;Reverse pressure Interventions (Mild/moderate discomfort): Hand massage;Reverse pressue  Hold (Positioning): Assistance needed to correctly position infant at breast and maintain latch. Intervention(s): Support Pillows;Position options  LATCH Score: 4  Lactation Tools Discussed/Used     Consult Status Consult Status: Complete    Hardie PulleyBerkelhammer, Allante Whitmire Boschen 09/21/2016, 11:23 AM

## 2016-09-21 NOTE — Plan of Care (Signed)
Problem: Education: Goal: Knowledge of condition will improve Discharge education reviewed with patient and significant other. Patient verbalizes understanding.    

## 2016-09-21 NOTE — Progress Notes (Signed)
Post Partum Day #2 Subjective: no complaints, up ad lib, voiding and tolerating PO  Objective: Blood pressure 118/79, pulse 81, temperature 97.7 F (36.5 C), temperature source Oral, resp. rate 18, height 5\' 3"  (1.6 m), weight 172 lb (78 kg), SpO2 98 %, unknown if currently breastfeeding.  Physical Exam:  General: alert, cooperative and no distress Lochia: appropriate Uterine Fundus: firm Incision: no significant drainage, no dehiscence, no significant erythema DVT Evaluation: No evidence of DVT seen on physical exam. No cords or calf tenderness. No significant calf/ankle edema.   Recent Labs  09/19/16 1843  HGB 13.5  HCT 37.3    Assessment/Plan: Discharge home, Breastfeeding and Contraception Depo injections   LOS: 2 days   Roe CoombsRachelle A Yesena Reaves, CNM 09/21/2016, 7:31 AM

## 2016-09-21 NOTE — Lactation Note (Signed)
This note was copied from a baby's chart. Lactation Consultation Note [facility] to Lt. Breast d/t filling and having difficulty latching. Mom using #20 NS d/t short shaft nipples. Encouraged mom to use "C" hold when first latching using NS. Once latched and baby nursing well can let go and massage breast. Noted milk in NS, heard good swallows. Baby satisfied after BF. Encouraged mom to pump after BF to relieve breast. Mom having difficulty getting hand pump to work. Adjusted rubber ring on hand, pumped breast w/immediate BM noted. Milk has transitioned from colostrum to breast milk. Breast heavy w/some small knots. Massaged and pumping w/good flow emptying breast. Educated on engorgement, filling and management.  Patient Name: Amanda Mcmahon Reason for consult: Follow-up assessment;Breast/nipple pain;Difficult latch   Maternal Data Has patient been taught Hand Expression?: Yes  Feeding Feeding Type: Breast Fed Length of feed: 15 min  LATCH Score/Interventions Latch: Grasps breast easily, tongue down, lips flanged, rhythmical sucking. Intervention(s): Adjust position;Assist with latch;Breast massage;Breast compression  Audible Swallowing: Spontaneous and intermittent Intervention(s): Skin to skin;Hand expression;Alternate breast massage  Type of Nipple: Everted at rest and after stimulation  Comfort (Breast/Nipple): Filling, red/small blisters or bruises, mild/mod discomfort  Problem noted: Filling;Mild/Moderate discomfort Interventions (Filling): Hand pump;Massage;Frequent nursing Interventions (Mild/moderate discomfort): Hand massage;Hand expression;Post-pump;Breast shields  Hold (Positioning): Assistance needed to correctly position infant at breast and maintain latch. Intervention(s): Breastfeeding basics reviewed;Support Pillows;Position options;Skin to skin  LATCH Score: 8  Lactation Tools Discussed/Used Tools: Nipple  Dorris CarnesShields;Pump Nipple shield size: 20 Breast pump type: Manual Pump Review: Setup, frequency, and cleaning;Milk Storage Initiated by:: L> Rubena Roseman RN IBCLC Date initiated:: 09/21/16   Consult Status Date: 09/21/16 Follow-up type: In-patient    Charyl DancerCARVER, Amanda Mcmahon Mcmahon, 6:51 AM

## 2016-09-21 NOTE — Discharge Summary (Signed)
Obstetric Discharge Summary Reason for Admission: onset of labor and rupture of membranes Prenatal Procedures: NST and ultrasound Intrapartum Procedures: spontaneous vaginal delivery Postpartum Procedures: none Complications-Operative and Postpartum: 2nd degree perineal laceration Hemoglobin  Date Value Ref Range Status  09/19/2016 13.5 12.0 - 15.0 g/dL Final   HCT  Date Value Ref Range Status  09/19/2016 37.3 36.0 - 46.0 % Final   Hematocrit  Date Value Ref Range Status  06/25/2016 38.4 34.0 - 46.6 % Final    Physical Exam:  General: alert, cooperative and no distress Lochia: appropriate Uterine Fundus: firm Incision: no significant drainage, no dehiscence, no significant erythema DVT Evaluation: No evidence of DVT seen on physical exam. No cords or calf tenderness. No significant calf/ankle edema.  Discharge Diagnoses: Term Pregnancy-delivered  Discharge Information: Date: 09/21/2016 Activity: pelvic rest Diet: routine Medications: PNV and Ibuprofen Condition: stable Instructions: refer to practice specific booklet Discharge to: home Follow-up Information    [facility] Follow up in 4 week(s).   Contact information: [address] [place] [phone]          Newborn Data: Live born female  Birth Weight: 6 lb 10.4 oz (3015 g) APGAR: 4, 7  Home with mother.  Amanda Mcmahon, CNM 09/21/2016, 7:34 AM

## 2016-09-22 ENCOUNTER — Inpatient Hospital Stay (HOSPITAL_COMMUNITY): Admission: RE | Admit: 2016-09-22 | Payer: 59 | Source: Ambulatory Visit

## 2016-10-30 ENCOUNTER — Ambulatory Visit: Payer: Medicaid Other | Admitting: Women's Health

## 2016-10-30 ENCOUNTER — Encounter: Payer: Self-pay | Admitting: Women's Health
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
## 2017-02-03 ENCOUNTER — Ambulatory Visit (INDEPENDENT_AMBULATORY_CARE_PROVIDER_SITE_OTHER): Payer: 59 | Admitting: Nurse Practitioner

## 2017-02-03 ENCOUNTER — Encounter: Payer: Self-pay | Admitting: Nurse Practitioner

## 2017-02-03 VITALS — BP 110/70 | Temp 98.8°F | Ht 63.0 in | Wt 150.1 lb

## 2017-02-03 DIAGNOSIS — A084 Viral intestinal infection, unspecified: Secondary | ICD-10-CM | POA: Diagnosis not present

## 2017-02-03 DIAGNOSIS — Z3042 Encounter for surveillance of injectable contraceptive: Secondary | ICD-10-CM

## 2017-02-03 LAB — POCT URINE PREGNANCY: Preg Test, Ur: NEGATIVE

## 2017-02-03 MED ORDER — ONDANSETRON 8 MG PO TBDP: 8.0000 mg | ORAL_TABLET | Freq: Three times a day (TID) | ORAL | 0 refills | Status: DC | PRN

## 2017-02-03 MED ORDER — MEDROXYPROGESTERONE ACETATE 150 MG/ML IM SUSP
150.0000 mg | Freq: Once | INTRAMUSCULAR | Status: AC
  Administered 2017-02-03: 150 mg via INTRAMUSCULAR

## 2017-02-03 NOTE — Progress Notes (Signed)
Subjective:  21 yo female presents today for possible stomach virus.  Symptoms began one day ago and include stomach cramps, back pain, diarrhea, and emesis.  Last episode of diarrhea was this am, last episode of emesis was last evening.   Was able to tolerate some water and soup today, but having persistent nausea.  Denies any fever/chills, cough, wheezing, or chest pain.   Would also like to restart Depo-provera today.  LMP was regular and started 01/21/17.  Has not been sexually active since.  Is not breast feeding.    Objective:   BP 110/70   Temp 98.8 F (37.1 C) (Oral)   Ht  (1.6 m)   Wt 150 lb 2 oz (68.1 kg)   LMP 01/21/2017 (Approximate)   Breastfeeding? No   BMI 26.59 kg/m  Alert and oriented, NAD. Chest: Lungs CTA Cardiac: RRR, no murmurs Abd:  Mild tenderness to palpation to upper abdomen midline, Bs+, no rebound tenderness or guarding  Results for orders placed or performed in visit on 02/03/17  POCT urine pregnancy  Result Value Ref Range   Preg Test, Ur Negative Negative    Assessment:  Viral gastroenteritis  Encounter for surveillance of injectable contraceptive - Plan: POCT urine pregnancy, medroxyPROGESTERone (DEPO-PROVERA) injection 150 mg  Plan:   Meds ordered this encounter  Medications  . ondansetron (ZOFRAN-ODT) 8 MG disintegrating tablet    Sig: Take 1 tablet (8 mg total) by mouth every 8 (eight) hours as needed for nausea or vomiting.    Dispense:  20 tablet    Refill:  0    Order Specific Question:   Supervising Provider    Answer:   Merlyn Albert [[id number]]  . medroxyPROGESTERone (DEPO-PROVERA) injection 150 mg    Symptom care and warning signs reviewed. Zofran given for nausea as needed.  Notify office if symptoms worsen or fail to improve.    Urine hcg negative, Depo-provera injection given in office.  Return to office in 3 months for next injection.     Return if symptoms worsen or fail to improve.

## 2017-04-22 ENCOUNTER — Ambulatory Visit: Payer: 59

## 2017-04-27 ENCOUNTER — Ambulatory Visit (INDEPENDENT_AMBULATORY_CARE_PROVIDER_SITE_OTHER): Payer: 59

## 2017-04-27 DIAGNOSIS — Z3042 Encounter for surveillance of injectable contraceptive: Secondary | ICD-10-CM

## 2017-04-27 MED ORDER — MEDROXYPROGESTERONE ACETATE 150 MG/ML IM SUSP
150.0000 mg | Freq: Once | INTRAMUSCULAR | Status: AC
  Administered 2017-04-27: 150 mg via INTRAMUSCULAR

## 2017-07-15 ENCOUNTER — Ambulatory Visit: Payer: Self-pay

## 2017-07-15 ENCOUNTER — Ambulatory Visit (INDEPENDENT_AMBULATORY_CARE_PROVIDER_SITE_OTHER): Payer: 59

## 2017-07-15 DIAGNOSIS — Z3042 Encounter for surveillance of injectable contraceptive: Secondary | ICD-10-CM

## 2017-07-15 DIAGNOSIS — Z3040 Encounter for surveillance of contraceptives, unspecified: Secondary | ICD-10-CM

## 2017-07-15 MED ORDER — MEDROXYPROGESTERONE ACETATE 150 MG/ML IM SUSP
150.0000 mg | Freq: Once | INTRAMUSCULAR | Status: AC
  Administered 2017-07-15: 150 mg via INTRAMUSCULAR

## 2017-07-15 MED ORDER — MEDROXYPROGESTERONE ACETATE 150 MG/ML IM SUSP: 150.0000 mg | Freq: Once | INTRAMUSCULAR | 3 refills | Status: DC

## 2017-10-01 ENCOUNTER — Ambulatory Visit (INDEPENDENT_AMBULATORY_CARE_PROVIDER_SITE_OTHER): Payer: 59 | Admitting: *Deleted

## 2017-10-01 DIAGNOSIS — Z309 Encounter for contraceptive management, unspecified: Secondary | ICD-10-CM

## 2017-10-01 MED ORDER — MEDROXYPROGESTERONE ACETATE 150 MG/ML IM SUSP
150.0000 mg | Freq: Once | INTRAMUSCULAR | Status: AC
  Administered 2017-10-01: 150 mg via INTRAMUSCULAR

## 2017-12-06 ENCOUNTER — Ambulatory Visit (INDEPENDENT_AMBULATORY_CARE_PROVIDER_SITE_OTHER): Payer: Managed Care, Other (non HMO) | Admitting: Nurse Practitioner

## 2017-12-06 ENCOUNTER — Encounter: Payer: Self-pay | Admitting: Nurse Practitioner

## 2017-12-06 VITALS — BP 114/78 | HR 74 | Temp 98.3°F | Ht 63.0 in | Wt 147.0 lb

## 2017-12-06 DIAGNOSIS — A084 Viral intestinal infection, unspecified: Secondary | ICD-10-CM | POA: Diagnosis not present

## 2017-12-06 MED ORDER — RANITIDINE HCL 300 MG PO TABS: 300.0000 mg | ORAL_TABLET | Freq: Every day | ORAL | 2 refills | Status: DC

## 2017-12-06 MED ORDER — ONDANSETRON 8 MG PO TBDP: 8.0000 mg | ORAL_TABLET | Freq: Three times a day (TID) | ORAL | 0 refills | Status: DC | PRN

## 2017-12-06 NOTE — Progress Notes (Signed)
Subjective:  Presents for recheck after being seen at urgent care on 2/2 for vomiting and diarrhea. Was sent to ED for dehydration and IV fluids. Patient states she left due to long wait. No further vomiting for 2 days. Continues to have nausea. Diarrhea has resolved. Some upper mid abd pain at times. No fever. No syncope. Taking some water and Gatorade. Voiding some but very dark.   Objective:   BP 114/78   Pulse 74   Temp 98.3 F (36.8 C) (Oral)   Ht 5\' 3"  (1.6 m)   Wt 147 lb (66.7 kg)   BMI 26.04 kg/m  NAD. Alert, oriented. Lungs clear. Heart RRR. Orthostatic BP stable. Abdomen soft, non distended with active BS x 4. Moderate epigastric area tenderness.   Assessment:  Viral gastroenteritis    Plan:   Meds ordered this encounter  Medications  . ondansetron (ZOFRAN-ODT) 8 MG disintegrating tablet    Sig: Take 1 tablet (8 mg total) by mouth every 8 (eight) hours as needed for nausea or vomiting.    Dispense:  20 tablet    Refill:  0    Order Specific Question:   Supervising Provider    Answer:   Merlyn AlbertLUKING, WILLIAM S [[id number]]  . ranitidine (ZANTAC) 300 MG tablet    Sig: Take 1 tablet (300 mg total) by mouth at bedtime.    Dispense:  30 tablet    Refill:  2    Order Specific Question:   Supervising Provider    Answer:   Merlyn AlbertLUKING, WILLIAM S [[id number]]   Increase clear fluid intake. Reviewed symptomatic care and warning signs. Gradually resume regular diet. Call back tomorrow if fluid intake has not improved.  Recheck if nausea persists.

## 2017-12-17 ENCOUNTER — Ambulatory Visit (INDEPENDENT_AMBULATORY_CARE_PROVIDER_SITE_OTHER): Payer: Managed Care, Other (non HMO) | Admitting: *Deleted

## 2017-12-17 DIAGNOSIS — Z3042 Encounter for surveillance of injectable contraceptive: Secondary | ICD-10-CM | POA: Diagnosis not present

## 2017-12-17 MED ORDER — MEDROXYPROGESTERONE ACETATE 150 MG/ML IM SUSP
150.0000 mg | Freq: Once | INTRAMUSCULAR | Status: AC
  Administered 2017-12-17: 150 mg via INTRAMUSCULAR

## 2018-01-24 ENCOUNTER — Other Ambulatory Visit: Payer: Self-pay

## 2018-01-24 MED ORDER — RANITIDINE HCL 300 MG PO TABS: 300.0000 mg | ORAL_TABLET | Freq: Every day | ORAL | 1 refills | Status: DC

## 2018-01-25 ENCOUNTER — Other Ambulatory Visit: Payer: Self-pay | Admitting: *Deleted

## 2018-02-01 ENCOUNTER — Telehealth: Payer: Self-pay | Admitting: Nurse Practitioner

## 2018-02-01 ENCOUNTER — Other Ambulatory Visit: Payer: Self-pay | Admitting: Nurse Practitioner

## 2018-02-01 MED ORDER — RANITIDINE HCL 300 MG PO TABS: 300.0000 mg | ORAL_TABLET | Freq: Every day | ORAL | 1 refills | Status: DC

## 2018-02-01 NOTE — Telephone Encounter (Signed)
[facility] on [address] in [place] sent over fax for 90 day supply of Ranitidine tab 300mg ; take one tab by mouth at bedtime

## 2018-03-08 ENCOUNTER — Ambulatory Visit (INDEPENDENT_AMBULATORY_CARE_PROVIDER_SITE_OTHER): Payer: Managed Care, Other (non HMO)

## 2018-03-08 DIAGNOSIS — Z3042 Encounter for surveillance of injectable contraceptive: Secondary | ICD-10-CM | POA: Diagnosis not present

## 2018-03-08 MED ORDER — MEDROXYPROGESTERONE ACETATE 150 MG/ML IM SUSP
150.0000 mg | Freq: Once | INTRAMUSCULAR | Status: AC
  Administered 2018-03-08: 150 mg via INTRAMUSCULAR

## 2018-05-24 ENCOUNTER — Ambulatory Visit (INDEPENDENT_AMBULATORY_CARE_PROVIDER_SITE_OTHER): Payer: Managed Care, Other (non HMO) | Admitting: *Deleted

## 2018-05-24 DIAGNOSIS — Z3042 Encounter for surveillance of injectable contraceptive: Secondary | ICD-10-CM | POA: Diagnosis not present

## 2018-05-24 DIAGNOSIS — Z309 Encounter for contraceptive management, unspecified: Secondary | ICD-10-CM

## 2018-05-24 MED ORDER — MEDROXYPROGESTERONE ACETATE 150 MG/ML IM SUSP
150.0000 mg | Freq: Once | INTRAMUSCULAR | Status: AC
  Administered 2018-05-24: 150 mg via INTRAMUSCULAR

## 2018-08-09 ENCOUNTER — Ambulatory Visit: Payer: Managed Care, Other (non HMO)

## 2018-08-10 ENCOUNTER — Ambulatory Visit (INDEPENDENT_AMBULATORY_CARE_PROVIDER_SITE_OTHER): Payer: No Typology Code available for payment source | Admitting: *Deleted

## 2018-08-10 DIAGNOSIS — Z3042 Encounter for surveillance of injectable contraceptive: Secondary | ICD-10-CM

## 2018-08-10 MED ORDER — MEDROXYPROGESTERONE ACETATE 150 MG/ML IM SUSP
150.0000 mg | Freq: Once | INTRAMUSCULAR | Status: AC
  Administered 2018-08-10: 150 mg via INTRAMUSCULAR

## 2018-11-01 ENCOUNTER — Ambulatory Visit: Payer: Managed Care, Other (non HMO)

## 2018-11-01 ENCOUNTER — Ambulatory Visit (INDEPENDENT_AMBULATORY_CARE_PROVIDER_SITE_OTHER): Payer: No Typology Code available for payment source

## 2018-11-01 DIAGNOSIS — Z3042 Encounter for surveillance of injectable contraceptive: Secondary | ICD-10-CM | POA: Diagnosis not present

## 2018-11-01 MED ORDER — MEDROXYPROGESTERONE ACETATE 150 MG/ML IM SUSP
150.0000 mg | Freq: Once | INTRAMUSCULAR | Status: AC
  Administered 2018-11-01: 150 mg via INTRAMUSCULAR

## 2019-01-12 ENCOUNTER — Ambulatory Visit (INDEPENDENT_AMBULATORY_CARE_PROVIDER_SITE_OTHER): Payer: 59 | Admitting: Family Medicine

## 2019-01-12 ENCOUNTER — Other Ambulatory Visit: Payer: Self-pay

## 2019-01-12 VITALS — Temp 98.2°F | Wt 152.8 lb

## 2019-01-12 DIAGNOSIS — J111 Influenza due to unidentified influenza virus with other respiratory manifestations: Secondary | ICD-10-CM

## 2019-01-12 MED ORDER — OSELTAMIVIR PHOSPHATE 75 MG PO CAPS: 75.0000 mg | ORAL_CAPSULE | Freq: Two times a day (BID) | ORAL | 0 refills | Status: DC

## 2019-01-12 NOTE — Progress Notes (Signed)
   Subjective:    Patient ID: Amanda Mcmahon, female    DOB: [birth year], 23 y.o.   MRN: [id number]  Cough  This is a new problem. The current episode started in the past 7 days. Associated symptoms include myalgias, nasal congestion and rhinorrhea. Pertinent negatives include no chest pain, ear pain, fever, shortness of breath or wheezing. She has tried OTC cough suppressant for the symptoms.    Significant congestion coughing denies vomiting diarrhea relates some body aches headaches low-grade fever  Review of Systems  Constitutional: Negative for activity change and fever.  HENT: Positive for congestion and rhinorrhea. Negative for ear pain.   Eyes: Negative for discharge.  Respiratory: Positive for cough. Negative for shortness of breath and wheezing.   Cardiovascular: Negative for chest pain.  Musculoskeletal: Positive for myalgias.       Objective:   Physical Exam Vitals signs and nursing note reviewed.  Constitutional:      Appearance: She is well-developed.  HENT:     Head: Normocephalic.     Nose: Nose normal.     Mouth/Throat:     Pharynx: No oropharyngeal exudate.  Neck:     Musculoskeletal: Neck supple.  Cardiovascular:     Rate and Rhythm: Normal rate.     Heart sounds: Normal heart sounds. No murmur.  Pulmonary:     Effort: Pulmonary effort is normal.     Breath sounds: Normal breath sounds. No wheezing.  Lymphadenopathy:     Cervical: No cervical adenopathy.  Skin:    General: Skin is warm and dry.     15 minutes was spent with patient today discussing healthcare issues which they came.  More than 50% of this visit-total duration of visit-was spent in counseling and coordination of care.  Please see diagnosis regarding the focus of this coordination and care       Assessment & Plan:  Influenza-the patient was diagnosed with influenza. Patient/family educated about the flu and warning signs to watch for. If difficulty breathing,  cyanosis,  disorientation, or progressive worsening then immediately get rechecked at the ER. If progressive symptoms be certain to be rechecked. Supportive measures such as Tylenol/ibuprofen was discussed. No aspirin use in children. Tamiflu as prescribed warning signs discussed follow-up if problems

## 2019-01-12 NOTE — Patient Instructions (Signed)

## 2019-01-18 ENCOUNTER — Ambulatory Visit (INDEPENDENT_AMBULATORY_CARE_PROVIDER_SITE_OTHER): Payer: 59 | Admitting: *Deleted

## 2019-01-18 ENCOUNTER — Other Ambulatory Visit: Payer: Self-pay

## 2019-01-18 DIAGNOSIS — Z3042 Encounter for surveillance of injectable contraceptive: Secondary | ICD-10-CM

## 2019-01-18 MED ORDER — MEDROXYPROGESTERONE ACETATE 150 MG/ML IM SUSP
150.0000 mg | Freq: Once | INTRAMUSCULAR | Status: AC
  Administered 2019-01-18: 150 mg via INTRAMUSCULAR

## 2019-03-27 ENCOUNTER — Other Ambulatory Visit: Payer: Self-pay

## 2019-03-27 ENCOUNTER — Emergency Department (HOSPITAL_COMMUNITY): Payer: Medicaid Other

## 2019-03-27 ENCOUNTER — Emergency Department (HOSPITAL_COMMUNITY)
Admission: EM | Admit: 2019-03-27 | Discharge: 2019-03-27 | Disposition: A | Discharge status: Home / Self Care | Payer: Medicaid Other | Attending: Emergency Medicine | Admitting: Emergency Medicine

## 2019-03-27 ENCOUNTER — Encounter (HOSPITAL_COMMUNITY): Payer: Self-pay | Admitting: *Deleted

## 2019-03-27 DIAGNOSIS — M545 Low back pain, unspecified: Secondary | ICD-10-CM

## 2019-03-27 DIAGNOSIS — R0602 Shortness of breath: Secondary | ICD-10-CM | POA: Diagnosis not present

## 2019-03-27 DIAGNOSIS — Z79899 Other long term (current) drug therapy: Secondary | ICD-10-CM | POA: Diagnosis not present

## 2019-03-27 LAB — CBC WITH DIFFERENTIAL/PLATELET
Abs Immature Granulocytes: 0.03 10*3/uL (ref 0.00–0.07)
Basophils Absolute: 0 10*3/uL (ref 0.0–0.1)
Basophils Relative: 0 %
Eosinophils Absolute: 0 10*3/uL (ref 0.0–0.5)
Eosinophils Relative: 1 %
HCT: 42.9 % (ref 36.0–46.0)
Hemoglobin: 15.1 g/dL — ABNORMAL HIGH (ref 12.0–15.0)
Immature Granulocytes: 0 %
Lymphocytes Relative: 27 %
Lymphs Abs: 2.3 10*3/uL (ref 0.7–4.0)
MCH: 31.9 pg (ref 26.0–34.0)
MCHC: 35.2 g/dL (ref 30.0–36.0)
MCV: 90.7 fL (ref 80.0–100.0)
Monocytes Absolute: 0.6 10*3/uL (ref 0.1–1.0)
Monocytes Relative: 7 %
Neutro Abs: 5.7 10*3/uL (ref 1.7–7.7)
Neutrophils Relative %: 65 %
Platelets: 358 10*3/uL (ref 150–400)
RBC: 4.73 MIL/uL (ref 3.87–5.11)
RDW: 12.1 % (ref 11.5–15.5)
WBC: 8.7 10*3/uL (ref 4.0–10.5)
nRBC: 0 % (ref 0.0–0.2)

## 2019-03-27 LAB — URINALYSIS, ROUTINE W REFLEX MICROSCOPIC
Bacteria, UA: NONE SEEN
Bilirubin Urine: NEGATIVE
Glucose, UA: NEGATIVE mg/dL
Ketones, ur: NEGATIVE mg/dL
Leukocytes,Ua: NEGATIVE
Nitrite: NEGATIVE
Protein, ur: NEGATIVE mg/dL
Specific Gravity, Urine: 1.008 (ref 1.005–1.030)
pH: 7 (ref 5.0–8.0)

## 2019-03-27 LAB — BASIC METABOLIC PANEL
Anion gap: 10 (ref 5–15)
BUN: 9 mg/dL (ref 6–20)
CO2: 25 mmol/L (ref 22–32)
Calcium: 9.5 mg/dL (ref 8.9–10.3)
Chloride: 105 mmol/L (ref 98–111)
Creatinine, Ser: 0.78 mg/dL (ref 0.44–1.00)
GFR calc Af Amer: >60 mL/min (ref >60)
GFR calc non Af Amer: >60 mL/min (ref >60)
Glucose, Bld: 131 mg/dL — ABNORMAL HIGH (ref 70–99)
Potassium: 3.8 mmol/L (ref 3.5–5.1)
Sodium: 140 mmol/L (ref 135–145)

## 2019-03-27 LAB — PREGNANCY, URINE: Preg Test, Ur: NEGATIVE

## 2019-03-27 LAB — D-DIMER, QUANTITATIVE: D-Dimer, Quant: <0.27 ug/mL-FEU (ref 0.00–0.50)

## 2019-03-27 MED ORDER — IBUPROFEN 400 MG PO TABS
400.0000 mg | ORAL_TABLET | Freq: Once | ORAL | Status: AC
  Administered 2019-03-27: 400 mg via ORAL
  Filled 2019-03-27: qty 1

## 2019-03-27 MED ORDER — FAMOTIDINE 20 MG PO TABS
20.0000 mg | ORAL_TABLET | Freq: Once | ORAL | Status: AC
  Administered 2019-03-27: 20 mg via ORAL
  Filled 2019-03-27: qty 1

## 2019-03-27 MED ORDER — ACETAMINOPHEN 500 MG PO TABS
1000.0000 mg | ORAL_TABLET | Freq: Once | ORAL | Status: AC
  Administered 2019-03-27: 1000 mg via ORAL
  Filled 2019-03-27: qty 2

## 2019-03-27 MED ORDER — PANTOPRAZOLE SODIUM 40 MG PO TBEC
40.0000 mg | DELAYED_RELEASE_TABLET | Freq: Once | ORAL | Status: AC
  Administered 2019-03-27: 40 mg via ORAL
  Filled 2019-03-27: qty 1

## 2019-03-27 MED ORDER — METHOCARBAMOL 500 MG PO TABS: 500.0000 mg | ORAL_TABLET | Freq: Three times a day (TID) | ORAL | 0 refills | Status: DC

## 2019-03-27 MED ORDER — TRAMADOL HCL 50 MG PO TABS: 50.0000 mg | ORAL_TABLET | Freq: Four times a day (QID) | ORAL | 0 refills | Status: DC | PRN

## 2019-03-27 NOTE — ED Notes (Signed)
Pt ambulatory to waiting room. Pt verbalized understanding of discharge instructions.   

## 2019-03-27 NOTE — Discharge Instructions (Signed)
Your chest x-ray is negative for acute problem.  Your d-dimer test is negative for evidence of a blood clot.  Your complete blood count is negative.  Your urine analysis and urine pregnancy test are negative.  Please use a heating pad to your lower back area.  Use Tylenol every 4 hours for mild to moderate pain.  Use Robaxin daily for spasm pain. Use Ultram for more severe pain.

## 2019-03-27 NOTE — ED Provider Notes (Signed)
[facility] EMERGENCY DEPARTMENT Provider Note   CSN: [id number] Arrival date & time: 03/27/19  1827    History   Chief Complaint Chief Complaint  Patient presents with  . Shortness of Breath    HPI Amanda Mcmahon is a 23 y.o. female.     Patient is a 23 year old female who presents to the emergency department with a complaint of shortness of breath, as well as a cramping sensation of her back and abdomen.  The patient states that she has been having a cramping sensation of her back and abdomen area for the past 2 to 3 days.  She is also had some problems with some heartburn at times.  Today she had a sensation of shortness of breath and lightheadedness.  The patient denies any fever, no cough, no unusual chest pain, no recent fall or accident or injury.  There is been no cough and certainly no hemoptysis reported.  She says nothing makes this pain better and nothing makes it worse.  The pain in the back and the abdomen and she says feels like a cramp.  None of the over-the-counter medications that she has been taking seem to have affected this area.  The patient denies any changes in her exercising or her usual activities.  She presents now for assistance with these multiple issues.  The history is provided by the patient.    Past Medical History:  Diagnosis Date  . Medical history non-contributory     Patient Active Problem List   Diagnosis Date Noted  . Normal labor 09/19/2016  . Supervision of normal first pregnancy 02/19/2016  . KNEE SPRAIN 07/28/2010    Past Surgical History:  Procedure Laterality Date  . NO PAST SURGERIES       OB History    Gravida  1   Para  1   Term  1   Preterm      AB      Living  1     SAB      TAB      Ectopic      Multiple  0   Live Births  1            Home Medications    Prior to Admission medications   Medication Sig Start Date End Date Taking? Authorizing Provider  ibuprofen (ADVIL,MOTRIN) 600 MG tablet  Take 1 tablet (600 mg total) by mouth every 6 (six) hours. Patient not taking: Reported on 02/03/2017 09/21/16   Orvilla Cornwall A, CNM  medroxyPROGESTERone (DEPO-PROVERA) 150 MG/ML injection Inject 1 mL (150 mg total) into the muscle once. 07/15/17 07/15/17  Babs Sciara, MD  ondansetron (ZOFRAN-ODT) 8 MG disintegrating tablet Take 1 tablet (8 mg total) by mouth every 8 (eight) hours as needed for nausea or vomiting. 12/06/17   Campbell Riches, NP  oseltamivir (TAMIFLU) 75 MG capsule Take 1 capsule (75 mg total) by mouth 2 (two) times daily. 01/12/19   Babs Sciara, MD  Prenat w/o A-[id number]-[id number]-FA-DHA (CITRANATAL ASSURE) 35-1 & 300 MG tablet Take 2 tablets by mouth daily.    [provider]  ranitidine (ZANTAC) 300 MG tablet Take 1 tablet (300 mg total) by mouth at bedtime. 02/01/18   Campbell Riches, NP    Family History Family History  Problem Relation Age of Onset  . Arthritis Mother   . Asthma Maternal Grandmother   . Cancer Maternal Grandmother        lung  . COPD Maternal Grandmother   .  Depression Maternal Grandmother   . Diabetes Maternal Grandmother   . Hypertension Maternal Grandmother   . Stroke Maternal Grandmother   . Hypertension Maternal Grandfather   . Diabetes Paternal Grandmother     Social History Social History   Tobacco Use  . Smoking status: Never Smoker  . Smokeless tobacco: Never Used  Substance Use Topics  . Alcohol use: No    Comment: in the past, now pregnant; not now  . Drug use: No     Allergies   Patient has no known allergies.   Review of Systems Review of Systems  Constitutional: Negative for activity change.       All ROS Neg except as noted in HPI  HENT: Negative for nosebleeds.   Eyes: Negative for photophobia and discharge.  Respiratory: Positive for shortness of breath. Negative for cough and wheezing.   Cardiovascular: Negative for chest pain and palpitations.  Gastrointestinal: Negative for abdominal pain and blood  in stool.  Genitourinary: Negative for dysuria, frequency and hematuria.  Musculoskeletal: Positive for back pain. Negative for arthralgias and neck pain.  Skin: Negative.   Neurological: Negative for dizziness, seizures and speech difficulty.  Psychiatric/Behavioral: Negative for confusion and hallucinations.     Physical Exam Updated Vital Signs BP 123/77 (BP Location: Right Arm)   Pulse (!) 104   Temp 98.4 F (36.9 C) (Oral)   Resp (!) 22   Ht 5\' 3"  (1.6 m)   Wt 74.8 kg   LMP  (LMP Unknown) Comment: irregular bleeding this month  SpO2 97%   BMI 29.23 kg/m   Physical Exam Vitals signs and nursing note reviewed.  Constitutional:      General: She is not in acute distress.    Appearance: She is well-developed. She is not toxic-appearing.  HENT:     Head: Normocephalic and atraumatic.     Right Ear: Tympanic membrane and external ear normal.     Left Ear: Tympanic membrane and external ear normal.  Eyes:     General: Lids are normal. No scleral icterus.       Right eye: No discharge.        Left eye: No discharge.     Conjunctiva/sclera: Conjunctivae normal.     Pupils: Pupils are equal, round, and reactive to light.  Neck:     Musculoskeletal: Normal range of motion and neck supple.     Vascular: No carotid bruit.     Trachea: No tracheal deviation.  Cardiovascular:     Rate and Rhythm: Normal rate and regular rhythm.     Pulses: Normal pulses.     Heart sounds: Normal heart sounds.  Pulmonary:     Effort: Pulmonary effort is normal. No respiratory distress.     Breath sounds: Normal breath sounds. No stridor. No wheezing or rales.  Abdominal:     General: Bowel sounds are normal. There is no distension.     Palpations: Abdomen is soft.     Tenderness: There is no abdominal tenderness. There is no guarding or rebound.  Musculoskeletal: Normal range of motion.        General: No tenderness.     Lumbar back: She exhibits pain and spasm.       Back:   Lymphadenopathy:     Head:     Right side of head: No submandibular adenopathy.     Left side of head: No submandibular adenopathy.     Cervical: No cervical adenopathy.  Skin:    General:  Skin is warm and dry.     Findings: No rash.  Neurological:     Mental Status: She is alert and oriented to person, place, and time.     Cranial Nerves: No cranial nerve deficit.     Sensory: No sensory deficit.     Motor: No abnormal muscle tone or seizure activity.     Coordination: Coordination normal.  Psychiatric:        Mood and Affect: Mood is not anxious.        Speech: Speech normal.      ED Treatments / Results  Labs (all labs ordered are listed, but only abnormal results are displayed) Labs Reviewed  CBC WITH DIFFERENTIAL/PLATELET  BASIC METABOLIC PANEL  URINALYSIS, ROUTINE W REFLEX MICROSCOPIC  PREGNANCY, URINE    EKG None  Radiology No results found.  Procedures Procedures (including critical care time)  Medications Ordered in ED Medications - No data to display   Initial Impression / Assessment and Plan / ED Course  I have reviewed the triage vital signs and the nursing notes.  Pertinent labs & imaging results that were available during my care of the patient were reviewed by me and considered in my medical decision making (see chart for details).          Final Clinical Impressions(s) / ED Diagnoses MDM  Vital signs reviewed.  Pulse oximetry is 97% on room air.  Within normal limits by my interpretation.  The patient speaks in complete sentences without problem.  There is no use of accessory muscles while breathing.  Pulse oximetry on the monitor is 97% on room air.  Urine pregnancy test is negative, doubt ectopic pregnancy.  Urine analysis is nonacute.  Doubt urinary tract infection or kidney stone or other urinary emergency. Complete blood count is nonacute.  The basic metabolic panel is also nonacute.  D-dimer is less than 0.27, negative for  pulmonary embolus.  Portable chest x-ray shows no edema, and no consolidation.  Recheck.  The patient is lying down in no distress texting on her phone.  Patient states she continues to have lower back pain.  She is no longer having sensation of difficulty with her breathing.  The patient will be treated with muscle relaxer.  I have asked the patient to return to the emergency department immediately if any problems with breathing, worsening of the back pain, changes in her condition, problems or concerns.  Patient is in agreement with this plan.   Final diagnoses:  SOB (shortness of breath)  Acute right-sided low back pain without sciatica    ED Discharge Orders         Ordered    methocarbamol (ROBAXIN) 500 MG tablet  3 times daily     03/27/19 2234    traMADol (ULTRAM) 50 MG tablet  Every 6 hours PRN     03/27/19 2234           Ivery QualeBryant, Taesha Goodell, PA-C 03/27/19 2255    Samuel JesterMcManus, Kathleen, DO 03/31/19 [phone]

## 2019-03-27 NOTE — ED Triage Notes (Signed)
Sob started today, denies hx.  Cramping to abd and back pain since 2 days. Denies fever.

## 2019-03-27 NOTE — ED Notes (Signed)
Checked on pt for urine sample,pt can't go right now,will try back in 30 minutes. 

## 2019-04-05 ENCOUNTER — Ambulatory Visit (INDEPENDENT_AMBULATORY_CARE_PROVIDER_SITE_OTHER): Payer: PRIVATE HEALTH INSURANCE

## 2019-04-05 ENCOUNTER — Other Ambulatory Visit: Payer: Self-pay

## 2019-04-05 DIAGNOSIS — Z3042 Encounter for surveillance of injectable contraceptive: Secondary | ICD-10-CM | POA: Diagnosis not present

## 2019-04-05 MED ORDER — MEDROXYPROGESTERONE ACETATE 150 MG/ML IM SUSP
150.0000 mg | Freq: Once | INTRAMUSCULAR | Status: AC
  Administered 2019-04-05: 150 mg via INTRAMUSCULAR

## 2019-06-15 ENCOUNTER — Other Ambulatory Visit: Payer: Self-pay

## 2019-06-15 ENCOUNTER — Ambulatory Visit
Admission: EM | Admit: 2019-06-15 | Discharge: 2019-06-15 | Disposition: A | Discharge status: Home / Self Care | Payer: Medicaid Other | Attending: Emergency Medicine | Admitting: Emergency Medicine

## 2019-06-15 DIAGNOSIS — Z711 Person with feared health complaint in whom no diagnosis is made: Secondary | ICD-10-CM

## 2019-06-15 NOTE — Discharge Instructions (Addendum)
COVID testing ordered.  It may take 5-7 days for test results.    In the meantime if you develop symptoms: You should remain isolated in your home for 10 days from symptom onset AND greater than 72 hours after symptoms resolution (absence of fever without the use of fever-reducing medication and improvement in respiratory symptoms), whichever is longer Get plenty of rest and push fluids Use OTC medications like ibuprofen or tylenol as needed fever or pain Call or go to the ED if you have any new or worsening symptoms such as fever, worsening cough, shortness of breath, chest tightness, chest pain, turning blue, changes in mental status, etc..Marland Kitchen

## 2019-06-15 NOTE — ED Triage Notes (Signed)
Pt has no symptoms, wants covid testing

## 2019-06-15 NOTE — ED Provider Notes (Signed)
[facility]   [id number] 06/15/19 Arrival Time: 1658   CC: COVID test  SUBJECTIVE: History from: patient.  Amanda Mcmahon is a 23 y.o. female who presents for COVID testing.  Denies sick exposure to COVID, flu or strep.  Denies recent travel.  Daughter goes to daycare and currently has a runny nose.  Denies aggravating or alleviating factors. Denies previous COVID infection.  Denies fever, chills, fatigue, sinus pain, rhinorrhea, sore throat, cough, SOB, wheezing, chest pain, nausea, vomiting, changes in bowel or bladder habits.    ROS: As per HPI.  All other pertinent ROS negative.     Past Medical History:  Diagnosis Date  . Medical history non-contributory    Past Surgical History:  Procedure Laterality Date  . NO PAST SURGERIES     No Known Allergies No current facility-administered medications on file prior to encounter.    Current Outpatient Medications on File Prior to Encounter  Medication Sig Dispense Refill  . medroxyPROGESTERone (DEPO-PROVERA) 150 MG/ML injection Inject 1 mL (150 mg total) into the muscle once. 1 mL 3   Social History   Socioeconomic History  . Marital status: Single    Spouse name: Not on file  . Number of children: Not on file  . Years of education: Not on file  . Highest education level: Not on file  Occupational History  . Not on file  Social Needs  . Financial resource strain: Not on file  . Food insecurity    Worry: Not on file    Inability: Not on file  . Transportation needs    Medical: Not on file    Non-medical: Not on file  Tobacco Use  . Smoking status: Never Smoker  . Smokeless tobacco: Never Used  Substance and Sexual Activity  . Alcohol use: No    Comment: in the past, now pregnant; not now  . Drug use: No  . Sexual activity: Yes    Birth control/protection: None  Lifestyle  . Physical activity    Days per week: Not on file    Minutes per session: Not on file  . Stress: Not on file  Relationships  .  Social Musicianconnections    Talks on phone: Not on file    Gets together: Not on file    Attends religious service: Not on file    Active member of club or organization: Not on file    Attends meetings of clubs or organizations: Not on file    Relationship status: Not on file  . Intimate partner violence    Fear of current or ex partner: Not on file    Emotionally abused: Not on file    Physically abused: Not on file    Forced sexual activity: Not on file  Other Topics Concern  . Not on file  Social History Narrative  . Not on file   Family History  Problem Relation Age of Onset  . Arthritis Mother   . Asthma Maternal Grandmother   . Cancer Maternal Grandmother        lung  . COPD Maternal Grandmother   . Depression Maternal Grandmother   . Diabetes Maternal Grandmother   . Hypertension Maternal Grandmother   . Stroke Maternal Grandmother   . Hypertension Maternal Grandfather   . Diabetes Paternal Grandmother     OBJECTIVE:  Vitals:   06/15/19 1711  BP: 101/65  Pulse: 100  Resp: 20  Temp: 98.5 F (36.9 C)  SpO2: 96%  General appearance: alert; well-appearing; speaking in full sentences and tolerating own secretions HEENT: NCAT; Ears: EACs clear, TMs pearly gray; Eyes: PERRL.  EOM grossly intact. Nose: nares patent without rhinorrhea, Throat: oropharynx clear, tonsils non erythematous or enlarged, uvula midline  Neck: supple without LAD Lungs: unlabored respirations, symmetrical air entry; cough: absent; no respiratory distress; CTAB Heart: regular rate and rhythm.  Skin: warm and dry Psychological: alert and cooperative; normal mood and affect  ASSESSMENT & PLAN:  1. Physically well but worried    COVID testing ordered.  It may take 5-7 days for test results.    In the meantime if you develop symptoms: You should remain isolated in your home for 10 days from symptom onset AND greater than 72 hours after symptoms resolution (absence of fever without the use of  fever-reducing medication and improvement in respiratory symptoms), whichever is longer Get plenty of rest and push fluids Use OTC medications like ibuprofen or tylenol as needed fever or pain Call or go to the ED if you have any new or worsening symptoms such as fever, worsening cough, shortness of breath, chest tightness, chest pain, turning blue, changes in mental status, etc...   Reviewed expectations re: course of current medical issues. Questions answered. Outlined signs and symptoms indicating need for more acute intervention. Patient verbalized understanding. After Visit Summary given.         Lestine Box, PA-C 06/15/19 1738

## 2019-06-17 LAB — NOVEL CORONAVIRUS, NAA: SARS-CoV-2, NAA: NOT DETECTED

## 2019-06-19 ENCOUNTER — Encounter (HOSPITAL_COMMUNITY): Payer: Self-pay

## 2019-06-21 ENCOUNTER — Other Ambulatory Visit: Payer: Self-pay

## 2019-06-21 ENCOUNTER — Ambulatory Visit (INDEPENDENT_AMBULATORY_CARE_PROVIDER_SITE_OTHER): Payer: PRIVATE HEALTH INSURANCE | Admitting: *Deleted

## 2019-06-21 DIAGNOSIS — Z3042 Encounter for surveillance of injectable contraceptive: Secondary | ICD-10-CM

## 2019-06-21 DIAGNOSIS — Z309 Encounter for contraceptive management, unspecified: Secondary | ICD-10-CM

## 2019-06-21 MED ORDER — MEDROXYPROGESTERONE ACETATE 150 MG/ML IM SUSP
150.0000 mg | Freq: Once | INTRAMUSCULAR | Status: AC
  Administered 2019-06-21: 150 mg via INTRAMUSCULAR

## 2019-07-21 ENCOUNTER — Other Ambulatory Visit: Payer: Self-pay | Admitting: *Deleted

## 2019-07-21 DIAGNOSIS — Z20822 Contact with and (suspected) exposure to covid-19: Secondary | ICD-10-CM

## 2019-07-22 LAB — NOVEL CORONAVIRUS, NAA: SARS-CoV-2, NAA: NOT DETECTED

## 2019-08-09 ENCOUNTER — Other Ambulatory Visit: Payer: Self-pay | Admitting: *Deleted

## 2019-08-09 DIAGNOSIS — Z20822 Contact with and (suspected) exposure to covid-19: Secondary | ICD-10-CM

## 2019-08-11 LAB — NOVEL CORONAVIRUS, NAA: SARS-CoV-2, NAA: NOT DETECTED

## 2019-08-25 ENCOUNTER — Other Ambulatory Visit: Payer: Self-pay

## 2019-08-25 ENCOUNTER — Ambulatory Visit (INDEPENDENT_AMBULATORY_CARE_PROVIDER_SITE_OTHER): Payer: 59 | Admitting: Nurse Practitioner

## 2019-08-25 ENCOUNTER — Encounter: Payer: Self-pay | Admitting: Nurse Practitioner

## 2019-08-25 VITALS — BP 118/78 | Temp 98.1°F | Ht 63.0 in | Wt 146.8 lb

## 2019-08-25 DIAGNOSIS — Z113 Encounter for screening for infections with a predominantly sexual mode of transmission: Secondary | ICD-10-CM | POA: Diagnosis not present

## 2019-08-25 DIAGNOSIS — Z0000 Encounter for general adult medical examination without abnormal findings: Secondary | ICD-10-CM | POA: Diagnosis not present

## 2019-08-25 DIAGNOSIS — Z01419 Encounter for gynecological examination (general) (routine) without abnormal findings: Secondary | ICD-10-CM | POA: Diagnosis not present

## 2019-08-25 DIAGNOSIS — L42 Pityriasis rosea: Secondary | ICD-10-CM

## 2019-08-25 DIAGNOSIS — Z124 Encounter for screening for malignant neoplasm of cervix: Secondary | ICD-10-CM

## 2019-08-25 DIAGNOSIS — L299 Pruritus, unspecified: Secondary | ICD-10-CM

## 2019-08-25 MED ORDER — PREDNISONE 20 MG PO TABS: ORAL_TABLET | ORAL | 0 refills | Status: DC

## 2019-08-25 MED ORDER — FAMOTIDINE 20 MG PO TABS: 20.0000 mg | ORAL_TABLET | Freq: Two times a day (BID) | ORAL | 0 refills | Status: DC

## 2019-08-25 NOTE — Patient Instructions (Addendum)
-Take Allegra/Claritin in the morning and take Benadryl in the morning to help with itchiness -Take prednisone until course complete -Take famotidine (Pepcid)  Pityriasis Rosea Pityriasis rosea is a rash that usually appears on the chest, abdomen, and back. It may also appear on the upper arms and upper legs. It usually begins as a single patch, and then more patches start to develop. The rash may cause mild itching, but it normally does not cause other problems. It usually goes away without treatment. However, it may take weeks or months for the rash to go away completely. What are the causes? The cause of this condition is not known. The condition does not spread from person to person (is not contagious). What increases the risk? This condition is more likely to develop in:  Persons aged 10-35 years.  Pregnant women. It is more common in the spring and fall seasons. What are the signs or symptoms? The main symptom of this condition is a rash.  The rash usually begins with a single oval patch that is larger than the ones that follow. This is called a herald patch. It generally appears a week or more before the rest of the rash appears.  When more patches start to develop, they spread quickly on the chest, abdomen, back, arms, and legs. These patches are smaller than the first one.  The patches that make up the rash are usually oval-shaped and pink or red in color. They are usually flat but may sometimes be raised so that they can be felt with a finger. They may also be finely crinkled and have a scaly ring around the edge. Some people may have mild itching and nonspecific symptoms, such as:  Nausea.  Loss of appetite.  Difficulty concentrating.  Headache.  Irritability.  Sore throat.  Mild fever. How is this diagnosed? This condition may be diagnosed based on:  Your medical history and a physical exam.  Tests to rule out other causes. This may include blood tests or a test  in which a small sample of skin is removed from the rash (biopsy) and checked in a lab. How is this treated?     Treatment is not usually needed for this condition. The rash will often go away on its own in 4-8 weeks. In some cases, a health care provider may recommend or prescribe medicine to reduce itching. Follow these instructions at home:  Take or apply over-the-counter and prescription medicines only as told by your health care provider.  Avoid scratching the affected areas of skin.  Do not take hot baths or use a sauna. Use only warm water when bathing or showering. Heat can increase itching. Adding cornstarch to your bath may help to relieve the itching.  Avoid exposure to the sun and other sources of UV light, such as tanning beds, as told by your health care provider. UV light may help the rash go away but may cause unwanted changes in skin color.  Keep all follow-up visits as told by your health care provider. This is important. Contact a health care provider if:  Your rash does not go away in 8 weeks.  Your rash gets much worse.  You have a fever.  You have swelling or pain in the rash area.  You have fluid, blood, or pus coming from the rash area. Summary  Pityriasis rosea is a rash that usually appears on the trunk of the body. It can also appear on the upper arms and upper legs.  The rash usually begins with a single oval patch (herald patch) that appears a week or more before the rest of the rash appears. The herald patch is larger than the ones that follow.  The rash may cause mild itching, but it usually does not cause other problems. It usually goes away without treatment in 4-8 weeks.  In some cases, a health care provider may recommend or prescribe medicine to reduce itching. This information is not intended to replace advice given to you by your health care provider. Make sure you discuss any questions you have with your health care provider. Document  Released: 11/25/2001 Document Revised: 10/18/2017 Document Reviewed: 10/18/2017 Elsevier Patient Education  2020 [employer].

## 2019-08-25 NOTE — Progress Notes (Signed)
Subjective:    Patient ID: Amanda Mcmahon, female    DOB: 1996-01-15, 23 y.o.   MRN: [id number]  HPI  The patient comes in today for a wellness visit. Pt tries to eat healthy, however she has been eating out and drinking more sodas recently. She walks a lot during the day and multiple times a week. Sleeps 7-8 hours per day. Patient has noticed a rash on her back, chest, and abdominal region that started 5 days ago. The rash first started on her neck and resembled a "ringworm", which has now resolved.  The rash is extremely pruritic. Pt has not tried any new clothing or jewelry. Patient thinks it may be the detergent she is using. No known contacts with these symptoms. She has tried hydrocortisone cream, otc allergy medication, and aveeno oatmeal baths with little improvement. Has regular eye and dental exams. She is not currently sexually active, however she has been in the last 3 months.   A review of their health history was completed.  A review of medications was also completed.  Any needed refills; no  Eating habits: not good  Falls/  MVA accidents in past few months: none  Regular exercise: sometimes  Specialist pt sees on regular basis: none  Preventative health issues were discussed.  Drug/Alcohol: Drinks 1-2 glasses of wine every other day. No history of tobacco. Does marijuana socially.   Additional concerns: rash on boy for 3-4 days  Depression screen [facility] 2/9 08/25/2019 08/25/2019  Decreased Interest 0 0  Down, Depressed, Hopeless 0 0  PHQ - 2 Score 0 0  Altered sleeping 0 -  Tired, decreased energy 0 -  Change in appetite 0 -  Feeling bad or failure about yourself  0 -  Trouble concentrating 1 -  Moving slowly or fidgety/restless 0 -  Suicidal thoughts 0 -  PHQ-9 Score 1 -  Difficult doing work/chores Not difficult at all -    Review of Systems  Constitutional: Negative for activity change, appetite change, chills and fever.  HENT: Negative for ear pain,  sinus pain and sneezing.   Respiratory: Negative for cough, shortness of breath and wheezing.   Cardiovascular: Negative for chest pain, palpitations and leg swelling.  Gastrointestinal: Negative for abdominal pain, blood in stool, constipation, diarrhea and vomiting.  Genitourinary: Negative for difficulty urinating, dysuria, frequency, pelvic pain, vaginal bleeding and vaginal discharge.  Musculoskeletal: Negative for back pain and myalgias.  Skin: Positive for rash.  Neurological: Negative for dizziness, syncope, weakness and light-headedness.  Psychiatric/Behavioral: Negative for confusion and sleep disturbance.       Objective:   Physical Exam Exam conducted with a chaperone present.  Constitutional:      Appearance: Normal appearance.  Neck:     Thyroid: No thyroid mass or thyromegaly.  Cardiovascular:     Rate and Rhythm: Normal rate and regular rhythm.     Heart sounds: No murmur.  Pulmonary:     Effort: No respiratory distress.     Breath sounds: Normal breath sounds. No stridor. No wheezing or rhonchi.  Chest:     Chest wall: No mass, lacerations, swelling or tenderness.     Breasts:        Right: Normal. No swelling, bleeding, inverted nipple or mass.        Left: Normal. No swelling, bleeding, inverted nipple or mass.  Abdominal:     General: Abdomen is flat.     Palpations: There is no mass.  Tenderness: There is no abdominal tenderness. There is no rebound.     Hernia: No hernia is present.  Genitourinary:    Exam position: Lithotomy position.     Comments: External: No rash or lesion. Vagina pink and moist Internal: Slight milky discharge on cervical os. No odor. No CMT. No tenderness or masses during bimanual exam. Lymphadenopathy:     Upper Body:     Right upper body: No supraclavicular, axillary or pectoral adenopathy.     Left upper body: No supraclavicular, axillary or pectoral adenopathy.  Skin:    General: Skin is warm and dry.     Comments:  Small, discrete oval hypopigmented scaly lesions with well-defined margins near anterior axilla bilaterally. Generalized small papular, pruritic skin-colored rash located across the chest, upper back, and abdomen bilaterally.  Neurological:     Mental Status: She is alert and oriented to person, place, and time.  Psychiatric:        Mood and Affect: Mood normal.        Behavior: Behavior normal.           Assessment & Plan:   Problem List Items Addressed This Visit    None    Visit Diagnoses    Well woman exam    -  Primary   Relevant Orders   Pap IG and Chlamydia/Gonococcus, NAA   Screening for cervical cancer       Relevant Orders   Pap IG and Chlamydia/Gonococcus, NAA   Routine screening for STI (sexually transmitted infection)       Relevant Orders   Pap IG and Chlamydia/Gonococcus, NAA   Pityriasis rosea       Pruritus           Meds ordered this encounter  Medications  . predniSONE (DELTASONE) 20 MG tablet    Sig: 3 po qd x 3 d then 2 po qd x 3 d then 1 po qd x 2 d    Dispense:  17 tablet    Refill:  0    Order Specific Question:   Supervising Provider    Answer:   Lilyan Punt A [[id number]]  . famotidine (PEPCID) 20 MG tablet    Sig: Take 1 tablet (20 mg total) by mouth 2 (two) times daily.    Dispense:  30 tablet    Refill:  0    Order Specific Question:   Supervising Provider    Answer:   Lilyan Punt A [[id number]]   -Recommended continued lifestyle and dietary modifications. Discussed safe sex practices. Strongly suspect rash is pityriasis rosea, medications prescribed to control the itching. Educated pt that it can take several weeks for rash to fully resolve. Educated pt that part of the rash could possibly be an allergic reaction, therefore important to watch for reappearance and contact the office for new symptoms. Education provided about steroids and side effects to monitor. Flu vaccine postponed due to initiation of prednisone, instructed pt to call office to  receive flu vaccine 1 week after prednisone or get it at a local pharmacy.   Return in 1 year (on 08/24/2020), or if symptoms worsen or fail to improve, for physical.

## 2019-08-25 NOTE — Progress Notes (Signed)
   Subjective:    Patient ID: Amanda Mcmahon, female    DOB: April 30, 1996, 23 y.o.   MRN: [id number]  HPI   The patient comes in today for a wellness visit.    A review of their health history was completed.  A review of medications was also completed.  Any needed refills; no  Eating habits: not good  Falls/  MVA accidents in past few months: none  Regular exercise: sometimes  Specialist pt sees on regular basis: none  Preventative health issues were discussed.   Additional concerns: rash on boy for 3-4 days  Review of Systems     Objective:   Physical Exam        Assessment & Plan:

## 2019-09-04 LAB — PAP IG AND CT-NG NAA
Chlamydia, Nuc. Acid Amp: NEGATIVE
Gonococcus by Nucleic Acid Amp: NEGATIVE

## 2019-09-06 ENCOUNTER — Other Ambulatory Visit: Payer: Self-pay

## 2019-09-06 ENCOUNTER — Ambulatory Visit (INDEPENDENT_AMBULATORY_CARE_PROVIDER_SITE_OTHER): Payer: 59

## 2019-09-06 DIAGNOSIS — Z20822 Contact with and (suspected) exposure to covid-19: Secondary | ICD-10-CM

## 2019-09-06 DIAGNOSIS — Z3042 Encounter for surveillance of injectable contraceptive: Secondary | ICD-10-CM | POA: Diagnosis not present

## 2019-09-06 MED ORDER — MEDROXYPROGESTERONE ACETATE 150 MG/ML IM SUSP
150.0000 mg | Freq: Once | INTRAMUSCULAR | Status: AC
  Administered 2019-09-06: 150 mg via INTRAMUSCULAR

## 2019-09-07 LAB — NOVEL CORONAVIRUS, NAA: SARS-CoV-2, NAA: DETECTED — AB

## 2019-09-08 ENCOUNTER — Telehealth: Payer: Self-pay | Admitting: *Deleted

## 2019-09-08 ENCOUNTER — Other Ambulatory Visit: Payer: Self-pay | Admitting: Nurse Practitioner

## 2019-09-08 DIAGNOSIS — R87612 Low grade squamous intraepithelial lesion on cytologic smear of cervix (LGSIL): Secondary | ICD-10-CM

## 2019-09-08 NOTE — Telephone Encounter (Signed)
See Result Note for positive COVID-19 notes.    [facility] Dept notified.

## 2019-09-11 ENCOUNTER — Encounter: Payer: Self-pay | Admitting: Family Medicine

## 2019-09-12 ENCOUNTER — Other Ambulatory Visit: Payer: Self-pay

## 2019-09-12 DIAGNOSIS — Z20822 Contact with and (suspected) exposure to covid-19: Secondary | ICD-10-CM

## 2019-09-13 LAB — SPECIMEN STATUS REPORT

## 2019-09-14 ENCOUNTER — Telehealth: Payer: Self-pay | Admitting: Family Medicine

## 2019-09-14 LAB — NOVEL CORONAVIRUS, NAA: SARS-CoV-2, NAA: DETECTED — AB

## 2019-09-14 NOTE — Telephone Encounter (Signed)
Patient is calling back for her COVID test results. Held on the line for 2 mins. [phone] Thank you so much!

## 2019-09-14 NOTE — Telephone Encounter (Signed)
Spoke with patient and discuss result and guidelines.

## 2019-09-25 LAB — HPV, 16/18,45: HPV, high-risk: POSITIVE — AB

## 2019-09-25 LAB — HPV GENOTYPES 16/18,45
HPV Genotype 16: NEGATIVE
HPV Genotype 18,45: NEGATIVE

## 2019-09-25 LAB — SPECIMEN STATUS REPORT

## 2019-09-27 ENCOUNTER — Encounter: Payer: Self-pay | Admitting: Nurse Practitioner

## 2019-10-11 ENCOUNTER — Other Ambulatory Visit: Payer: Self-pay

## 2019-10-11 ENCOUNTER — Encounter: Payer: Self-pay | Admitting: Obstetrics and Gynecology

## 2019-10-11 ENCOUNTER — Ambulatory Visit (INDEPENDENT_AMBULATORY_CARE_PROVIDER_SITE_OTHER): Payer: 59 | Admitting: Obstetrics and Gynecology

## 2019-10-11 VITALS — BP 113/81 | HR 84 | Ht 65.0 in | Wt 145.4 lb

## 2019-10-11 DIAGNOSIS — N871 Moderate cervical dysplasia: Secondary | ICD-10-CM | POA: Diagnosis not present

## 2019-10-11 DIAGNOSIS — N870 Mild cervical dysplasia: Secondary | ICD-10-CM

## 2019-10-11 DIAGNOSIS — Z3202 Encounter for pregnancy test, result negative: Secondary | ICD-10-CM | POA: Diagnosis not present

## 2019-10-11 LAB — POCT URINE PREGNANCY: Preg Test, Ur: NEGATIVE

## 2019-10-11 NOTE — Progress Notes (Addendum)
Patient ID: Amanda Mcmahon, female   DOB: 30-Mar-1996, 23 y.o.   MRN: [id number]  [name] 23 y.o. 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 Discussed role for HPV in cervical dysplasia, need for surveillance.  Patient given informed consent, signed copy in the chart, time out was performed.  Placed in lithotomy position. Cervix viewed with speculum and colposcope after application of acetic acid.   Colposcopy adequate? Yes  Biopsies obtained at 10-11 o clock and 7-8 o clock ECC specimen obtained. All specimens were labelled and sent to pathology. Monsel's solution applied to the cervix.   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 Will follow up pathology and manage accordingly.  Routine preventative health maintenance measures emphasized.  By signing my name below, I, De Burrs, attest that this documentation has been prepared under the direction and in the presence of Jonnie Kind, MD. Electronically Signed: De Burrs, Medical Scribe. 10/11/19. 1:53 PM.  I personally performed the services described in this documentation, which was SCRIBED in my presence. The recorded information has been reviewed and considered accurate. It has been edited as necessary during review. Jonnie Kind, MD

## 2019-10-23 ENCOUNTER — Telehealth: Payer: Self-pay | Admitting: Obstetrics and Gynecology

## 2019-10-23 NOTE — Telephone Encounter (Signed)
-----   Message from Jonnie Kind, MD sent at 10/17/2019  8:43 AM EST ----- Call  pt ., will need LEEP or cryo.

## 2019-10-31 ENCOUNTER — Ambulatory Visit: Payer: Managed Care, Other (non HMO) | Attending: Internal Medicine

## 2019-10-31 ENCOUNTER — Other Ambulatory Visit: Payer: Self-pay

## 2019-10-31 DIAGNOSIS — Z20822 Contact with and (suspected) exposure to covid-19: Secondary | ICD-10-CM

## 2019-11-01 ENCOUNTER — Encounter: Payer: Self-pay | Admitting: Nurse Practitioner

## 2019-11-01 ENCOUNTER — Ambulatory Visit (INDEPENDENT_AMBULATORY_CARE_PROVIDER_SITE_OTHER): Payer: Managed Care, Other (non HMO) | Admitting: Nurse Practitioner

## 2019-11-01 DIAGNOSIS — N921 Excessive and frequent menstruation with irregular cycle: Secondary | ICD-10-CM | POA: Diagnosis not present

## 2019-11-01 DIAGNOSIS — B349 Viral infection, unspecified: Secondary | ICD-10-CM | POA: Diagnosis not present

## 2019-11-01 LAB — NOVEL CORONAVIRUS, NAA: SARS-CoV-2, NAA: NOT DETECTED

## 2019-11-01 MED ORDER — MEGESTROL ACETATE 40 MG PO TABS: ORAL_TABLET | ORAL | 0 refills | Status: DC

## 2019-11-01 NOTE — Progress Notes (Signed)
  Phone visit Subjective:    Patient ID: Amanda Mcmahon, female    DOB: 09/08/1996, 23 y.o.   MRN: [id number]  HPI 3 days ago started having nausea and diarrhea. Sob yesterday. Nausea, Fatigue and bodyaches today. Has not tried any treatments.   Virtual Visit via Telephone Note  I connected with Naaman Plummer on 11/01/19 at  9:00 AM EST by telephone and verified that I am speaking with the correct person using two identifiers.  Location: Patient: home Provider: office   I discussed the limitations, risks, security and privacy concerns of performing an evaluation and management service by telephone and the availability of in person appointments. I also discussed with the patient that there may be a patient responsible charge related to this service. The patient expressed understanding and agreed to proceed.   History of Present Illness: Presents for c/o nausea and diarrhea that occurred yesterday. No vomiting. Slight cough. Mild SOB with activity, has not noted this today so far. No edema or CP. No ear pain, sore throat or wheezing. Muscle aches. No fever. Has tried a heating pad but no OTC meds. Taking fluids well. Voiding nl. No known contacts. Tested for COVID yesterday, waiting on results. Is out of work until then. Mild light sensitivity. Mild neck pain but no rigidity. No headache. Has tried  Also mentions that she has has breakthrough bleeding since her colposcopy on 12/9. Cramping at times. Currently on Depo Provera for birth control. Last injection 11/3.    Observations/Objective: Today's visit was via telephone Physical exam was not possible for this visit Alert, oriented. Thoughts logical, coherent and relevant.   Assessment and Plan: Viral illness  Breakthrough bleeding on depo provera  Meds ordered this encounter  Medications  . megestrol (MEGACE) 40 MG tablet    Sig: Take 3 tabs po qd x 5 d then 2 qd x 5 d then one qd for bleeding    Dispense:  30 tablet    Refill:   0    Order Specific Question:   Supervising Provider    Answer:   Sallee Lange A [[id number]]    Follow Up Instructions: Continue increased fluid intake. Warning signs reviewed including signs of respiratory distress, headache, vomiting or neck stiffness. Expect continued resolution of symptoms. COVID testing pending. Call or go to ED if any problems.  Wait a few days then start Megace once she is better. Call back if bleeding persists.  Return if symptoms worsen or fail to improve.    I discussed the assessment and treatment plan with the patient. The patient was provided an opportunity to ask questions and all were answered. The patient agreed with the plan and demonstrated an understanding of the instructions.   The patient was advised to call back or seek an in-person evaluation if the symptoms worsen or if the condition fails to improve as anticipated.  I provided 15 minutes of non-face-to-face time during this encounter.       Review of Systems     Objective:   Physical Exam        Assessment & Plan:

## 2019-11-02 ENCOUNTER — Encounter: Payer: Self-pay | Admitting: Family Medicine

## 2019-11-06 ENCOUNTER — Telehealth: Payer: Self-pay | Admitting: *Deleted

## 2019-11-06 NOTE — Telephone Encounter (Signed)
Pt not sure she wants to have the LEEP procedure. She may be interested in the freezing procedure. I advised to call tomorrow to schedule and say she wants to discuss LEEP but not necessarily have LEEP. Pt voiced understanding. JSY

## 2019-11-06 NOTE — Telephone Encounter (Signed)
Pt left message that she needs to discuss the LEEP procedure with someone.

## 2019-11-14 ENCOUNTER — Telehealth: Payer: Self-pay | Admitting: Obstetrics and Gynecology

## 2019-11-14 ENCOUNTER — Telehealth: Payer: Self-pay | Admitting: *Deleted

## 2019-11-14 NOTE — Telephone Encounter (Signed)
Pt called , will do cryo. Note sent to Drue Stager to arrange appt or Cryo.

## 2019-11-14 NOTE — Telephone Encounter (Signed)
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 Pt will be contacted to schedule Cryocautery in Jan or Feb.

## 2019-11-14 NOTE — Telephone Encounter (Signed)
Patient states she does not want to proceed with LEEP but would rather do Cryo.  Can you advise?

## 2019-11-22 ENCOUNTER — Other Ambulatory Visit (INDEPENDENT_AMBULATORY_CARE_PROVIDER_SITE_OTHER): Payer: No Typology Code available for payment source | Admitting: *Deleted

## 2019-11-22 ENCOUNTER — Other Ambulatory Visit: Payer: Self-pay

## 2019-11-22 DIAGNOSIS — Z3042 Encounter for surveillance of injectable contraceptive: Secondary | ICD-10-CM | POA: Diagnosis not present

## 2019-11-22 MED ORDER — MEDROXYPROGESTERONE ACETATE 150 MG/ML IM SUSP
150.0000 mg | Freq: Once | INTRAMUSCULAR | Status: AC
  Administered 2019-11-22: 150 mg via INTRAMUSCULAR

## 2019-12-13 ENCOUNTER — Other Ambulatory Visit: Payer: Self-pay

## 2019-12-13 ENCOUNTER — Ambulatory Visit (INDEPENDENT_AMBULATORY_CARE_PROVIDER_SITE_OTHER): Payer: No Typology Code available for payment source | Admitting: Obstetrics and Gynecology

## 2019-12-13 ENCOUNTER — Encounter: Payer: Self-pay | Admitting: Obstetrics and Gynecology

## 2019-12-13 VITALS — Ht 63.0 in | Wt 144.0 lb

## 2019-12-13 DIAGNOSIS — Z3202 Encounter for pregnancy test, result negative: Secondary | ICD-10-CM | POA: Diagnosis not present

## 2019-12-13 DIAGNOSIS — N871 Moderate cervical dysplasia: Secondary | ICD-10-CM

## 2019-12-13 LAB — POCT URINE PREGNANCY: Preg Test, Ur: NEGATIVE

## 2019-12-13 NOTE — Progress Notes (Signed)
Patient ID: Amanda Mcmahon, female   DOB: 10/02/1996, 24 y.o.   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 She was counseled about that efficacy of this procedure, and possible need for excisional procedure in the future if her cervical dysplasia persists.  Small 1.5 cm probe used. The risks of the procedure where explained in detail and patient was told to expect a copious amount of discharge in the next few weeks. All her questions were answered, and written informed consent was obtained.  The patient was placed in the dorsal lithotomy position and a vaginal speculum was placed. Her cervix was visualized and patient was noted to have had normal size transformation zone. The appropriate cryotherapy probe was picked and affixed to cryotherapy apparatus. Then nitrogen gas was then activated, the probe was coated with lubricating jelly and applied to the transformation zone of the cervix. This was kept in place for 3 minutes.,  And involve the posterior lip of the cervix primarily the cryotherapy was then stopped and all instruments were removed from the patient's pelvis; a thawing period of 3 minutes was observed.  A second cycle of cryotherapy was then administered to the cervix for 3 minutes.,  Well positioned over the cervical os  The patient tolerated the procedure well without any complications. Routine post procedure instructions were given to the patient.  Will repeat pap smear in 6 months and manage accordingly.   By signing my name below, I, Arnette Norris, attest that this documentation has been prepared under  the direction and in the presence of Tilda Burrow, MD. Electronically Signed: Arnette Norris Medical Scribe. 12/13/19. 12:07 PM.  I personally performed the services described in this documentation, which was SCRIBED in my presence. The recorded information has been reviewed and considered accurate. It has been edited as necessary during review. Tilda Burrow, MD

## 2020-01-10 ENCOUNTER — Encounter: Payer: Self-pay | Admitting: Obstetrics and Gynecology

## 2020-01-10 ENCOUNTER — Other Ambulatory Visit: Payer: Self-pay

## 2020-01-10 ENCOUNTER — Ambulatory Visit (INDEPENDENT_AMBULATORY_CARE_PROVIDER_SITE_OTHER): Payer: 59 | Admitting: Obstetrics and Gynecology

## 2020-01-10 ENCOUNTER — Ambulatory Visit: Payer: 59 | Attending: Internal Medicine

## 2020-01-10 VITALS — BP 104/68 | HR 77 | Ht 63.0 in | Wt 144.4 lb

## 2020-01-10 DIAGNOSIS — N870 Mild cervical dysplasia: Secondary | ICD-10-CM

## 2020-01-10 DIAGNOSIS — Z20822 Contact with and (suspected) exposure to covid-19: Secondary | ICD-10-CM

## 2020-01-10 NOTE — Progress Notes (Signed)
Patient ID: Amanda Mcmahon, female   DOB: 01-21-1996, 24 y.o.   MRN: [id number]    [facility] Clinic Visit  @DATE @            Patient name: Amanda Mcmahon MRN [name]  Date of birth: Jun 12, 1996  CC & HPI:  Amanda Mcmahon is a 24 y.o. female presenting today for routine follow-up s/p Cryotherapy on 12/13/2019. She denies all complaints at this time.  ROS:  ROS  - pain cramping, mild only  Pertinent History Reviewed:   Reviewed Medical         Past Medical History:  Diagnosis Date  . Medical history non-contributory                               Surgical Hx:    Past Surgical History:  Procedure Laterality Date  . NO PAST SURGERIES     Medications: Reviewed & Updated - see associated section                       Current Outpatient Medications:  .  medroxyPROGESTERone (DEPO-PROVERA) 150 MG/ML injection, Inject 150 mg into the muscle every 3 (three) months., Disp: , Rfl:  .  medroxyPROGESTERone (DEPO-PROVERA) 150 MG/ML injection, Inject 1 mL (150 mg total) into the muscle once., Disp: 1 mL, Rfl: 3 .  megestrol (MEGACE) 40 MG tablet, Take 3 tabs po qd x 5 d then 2 qd x 5 d then one qd for bleeding (Patient not taking: Reported on 12/13/2019), Disp: 30 tablet, Rfl: 0   Social History: Reviewed -  reports that she has never smoked. She has never used smokeless tobacco.  Objective Findings:  Vitals: Blood pressure 104/68, pulse 77, height 5\' 3"  (1.6 m), weight 144 lb 6.4 oz (65.5 kg).  PHYSICAL EXAMINATION General appearance - alert, well appearing, and in no distress Mental status - alert, oriented to person, place, and time, normal mood, behavior, speech, dress, motor activity, and thought processes  PELVIC External genitalia - Normal Vagina - Normal Cervix - Incomplete healing of posterior lip of the cervix. Minimal d/c Expect full recovery within six weeks  Chaperoned by 02/10/2020.   Assessment & Plan:   A:  1.  Follow-up s/p Cryotherapy  P:  1.   Routine follow-up for PAP smear in one year.  By signing my name below, I, , attest that this documentation has been prepared under the direction and in the presence of Nikki Dom, MD. Electronically Signed: Nikki Dom Medical Scribe. 01/10/20. 11:16 AM.  I personally performed the services described in this documentation, which was SCRIBED in my presence. The recorded information has been reviewed and considered accurate. It has been edited as necessary during review. Nikki Dom, MD

## 2020-01-11 LAB — NOVEL CORONAVIRUS, NAA: SARS-CoV-2, NAA: NOT DETECTED

## 2020-02-07 ENCOUNTER — Other Ambulatory Visit (INDEPENDENT_AMBULATORY_CARE_PROVIDER_SITE_OTHER): Payer: 59 | Admitting: *Deleted

## 2020-02-07 ENCOUNTER — Other Ambulatory Visit: Payer: Self-pay

## 2020-02-07 DIAGNOSIS — Z3042 Encounter for surveillance of injectable contraceptive: Secondary | ICD-10-CM | POA: Diagnosis not present

## 2020-02-07 DIAGNOSIS — Z309 Encounter for contraceptive management, unspecified: Secondary | ICD-10-CM

## 2020-02-07 MED ORDER — MEDROXYPROGESTERONE ACETATE 150 MG/ML IM SUSP
150.0000 mg | Freq: Once | INTRAMUSCULAR | Status: AC
  Administered 2020-02-07: 150 mg via INTRAMUSCULAR

## 2020-02-17 ENCOUNTER — Ambulatory Visit: Payer: 59 | Attending: Internal Medicine

## 2020-02-17 DIAGNOSIS — Z23 Encounter for immunization: Secondary | ICD-10-CM

## 2020-02-17 NOTE — Progress Notes (Signed)
   Covid-19 Vaccination Clinic  Name:  Amanda Mcmahon    MRN: [id number] DOB: 1996-10-27  02/17/2020  [name] was observed post Covid-19 immunization for 15 minutes without incident. She was provided with Vaccine Information Sheet and instruction to access the V-Safe system.   [name] was instructed to call 911 with any severe reactions post vaccine: Marland Kitchen Difficulty breathing  . Swelling of face and throat  . A fast heartbeat  . A bad rash all over body  . Dizziness and weakness   Immunizations Administered    Name Date Dose VIS Date Route   Pfizer COVID-19 Vaccine 02/17/2020 11:13 AM 0.3 mL 10/13/2019 Intramuscular   Manufacturer: [employer], [employer]   Lot: [id number]   NDC: [address]-0

## 2020-03-11 ENCOUNTER — Ambulatory Visit: Payer: 59

## 2020-03-12 ENCOUNTER — Ambulatory Visit
Admission: EM | Admit: 2020-03-12 | Discharge: 2020-03-12 | Disposition: A | Discharge status: Home / Self Care | Payer: 59 | Attending: Emergency Medicine | Admitting: Emergency Medicine

## 2020-03-12 ENCOUNTER — Other Ambulatory Visit: Payer: Self-pay

## 2020-03-12 DIAGNOSIS — R52 Pain, unspecified: Secondary | ICD-10-CM

## 2020-03-12 DIAGNOSIS — T50Z95A Adverse effect of other vaccines and biological substances, initial encounter: Secondary | ICD-10-CM

## 2020-03-12 MED ORDER — IBUPROFEN 800 MG PO TABS: 800.0000 mg | ORAL_TABLET | Freq: Three times a day (TID) | ORAL | 0 refills | Status: DC

## 2020-03-12 NOTE — Discharge Instructions (Signed)
Get plenty of rest and push fluids Ibuprofen prescribed.  Take as needed Work note given Go to the ED if you have any new or worsening symptoms such as fever, cough, shortness of breath, chest tightness, chest pain, turning blue, changes in mental status, etc..Marland Kitchen

## 2020-03-12 NOTE — ED Provider Notes (Signed)
[place]   [id number] 03/12/20 Arrival Time: 0900   CC: generalized body aches  SUBJECTIVE: History from: patient.  Amanda Mcmahon is a 24 y.o. female who presents with generalized body aches, sore throat, nausea, and 1 episode of vomiting x 1 day.  Received 2nd COVID vaccine yesterday.  Needs work note.  Denies alleviating or aggravating factors.  Denies previous symptoms in the past.   Denies fever, chills, sinus pain, rhinorrhea, SOB, wheezing, chest pain, nausea, changes in bowel or bladder habits.    ROS: As per HPI.  All other pertinent ROS negative.     Past Medical History:  Diagnosis Date  . Medical history non-contributory    Past Surgical History:  Procedure Laterality Date  . NO PAST SURGERIES     No Known Allergies No current facility-administered medications on file prior to encounter.   Current Outpatient Medications on File Prior to Encounter  Medication Sig Dispense Refill  . medroxyPROGESTERone (DEPO-PROVERA) 150 MG/ML injection Inject 1 mL (150 mg total) into the muscle once. 1 mL 3  . medroxyPROGESTERone (DEPO-PROVERA) 150 MG/ML injection Inject 150 mg into the muscle every 3 (three) months.    . megestrol (MEGACE) 40 MG tablet Take 3 tabs po qd x 5 d then 2 qd x 5 d then one qd for bleeding (Patient not taking: Reported on 12/13/2019) 30 tablet 0   Social History   Socioeconomic History  . Marital status: Single    Spouse name: Not on file  . Number of children: 1  . Years of education: Not on file  . Highest education level: Not on file  Occupational History  . Not on file  Tobacco Use  . Smoking status: Never Smoker  . Smokeless tobacco: Never Used  Substance and Sexual Activity  . Alcohol use: No    Comment: in the past, now pregnant; not now  . Drug use: Yes    Types: Marijuana    Comment: socially  . Sexual activity: Yes    Birth control/protection: Injection  Other Topics Concern  . Not on file  Social History Narrative   . Not on file   Social Determinants of Health   Financial Resource Strain:   . Difficulty of Paying Living Expenses:   Food Insecurity:   . Worried About Charity fundraiser in the Last Year:   . Arboriculturist in the Last Year:   Transportation Needs:   . Film/video editor (Medical):   Marland Kitchen Lack of Transportation (Non-Medical):   Physical Activity:   . Days of Exercise per Week:   . Minutes of Exercise per Session:   Stress:   . Feeling of Stress :   Social Connections:   . Frequency of Communication with Friends and Family:   . Frequency of Social Gatherings with Friends and Family:   . Attends Religious Services:   . Active Member of Clubs or Organizations:   . Attends Archivist Meetings:   Marland Kitchen Marital Status:   Intimate Partner Violence:   . Fear of Current or Ex-Partner:   . Emotionally Abused:   Marland Kitchen Physically Abused:   . Sexually Abused:    Family History  Problem Relation Age of Onset  . Arthritis Mother   . Asthma Maternal Grandmother   . Cancer Maternal Grandmother        lung  . COPD Maternal Grandmother   . Depression Maternal Grandmother   . Diabetes Maternal Grandmother   .  Hypertension Maternal Grandmother   . Stroke Maternal Grandmother   . Hypertension Maternal Grandfather   . Diabetes Paternal Grandmother     OBJECTIVE:  Vitals:   03/12/20 0909  BP: 110/76  Pulse: 82  Resp: 20  Temp: 99.1 F (37.3 C)  SpO2: 99%     General appearance: alert; well-appearing, nontoxic; speaking in full sentences and tolerating own secretions; using mobile device without difficulty HEENT: NCAT; Ears: EACs clear, TMs pearly gray; Eyes: PERRL.  EOM grossly intact.Nose: nares patent without rhinorrhea, Throat: oropharynx clear, tonsils non erythematous or enlarged, uvula midline  Neck: supple without LAD Lungs: unlabored respirations, symmetrical air entry; cough: absent; no respiratory distress; CTAB Heart: regular rate and rhythm.  Skin: warm and  dry Psychological: alert and cooperative; normal mood and affect   ASSESSMENT & PLAN:  1. Generalized body aches   2. Adverse effect of vaccine, initial encounter     Meds ordered this encounter  Medications  . ibuprofen (ADVIL) 800 MG tablet    Sig: Take 1 tablet (800 mg total) by mouth 3 (three) times daily.    Dispense:  21 tablet    Refill:  0    Order Specific Question:   Supervising Provider    Answer:   Eustace Moore [[id number]]    Get plenty of rest and push fluids Ibuprofen prescribed.  Take as needed Work note given Go to the ED if you have any new or worsening symptoms such as fever, cough, shortness of breath, chest tightness, chest pain, turning blue, changes in mental status, etc...   Reviewed expectations re: course of current medical issues. Questions answered. Outlined signs and symptoms indicating need for more acute intervention. Patient verbalized understanding. After Visit Summary given.         Rennis Harding, PA-C 03/12/20 [phone]

## 2020-03-12 NOTE — ED Triage Notes (Signed)
Pt received 2nd covid vaccine yesterday, needs note for work for symptoms of body aches

## 2020-04-24 ENCOUNTER — Other Ambulatory Visit: Payer: Self-pay

## 2020-04-24 ENCOUNTER — Other Ambulatory Visit (INDEPENDENT_AMBULATORY_CARE_PROVIDER_SITE_OTHER): Payer: 59 | Admitting: *Deleted

## 2020-04-24 DIAGNOSIS — Z309 Encounter for contraceptive management, unspecified: Secondary | ICD-10-CM

## 2020-04-24 DIAGNOSIS — Z3042 Encounter for surveillance of injectable contraceptive: Secondary | ICD-10-CM

## 2020-04-24 MED ORDER — MEDROXYPROGESTERONE ACETATE 150 MG/ML IM SUSP
150.0000 mg | Freq: Once | INTRAMUSCULAR | Status: AC
  Administered 2020-04-24: 150 mg via INTRAMUSCULAR

## 2020-04-27 ENCOUNTER — Other Ambulatory Visit: Payer: Self-pay

## 2020-04-27 ENCOUNTER — Encounter (HOSPITAL_COMMUNITY): Payer: Self-pay | Admitting: Emergency Medicine

## 2020-04-27 ENCOUNTER — Ambulatory Visit
Admission: EM | Admit: 2020-04-27 | Discharge: 2020-04-27 | Disposition: A | Discharge status: Home / Self Care | Payer: 59 | Source: Home / Self Care

## 2020-04-27 ENCOUNTER — Emergency Department (HOSPITAL_COMMUNITY)
Admission: EM | Admit: 2020-04-27 | Discharge: 2020-04-27 | Disposition: A | Discharge status: Home / Self Care | Payer: 59 | Attending: Emergency Medicine | Admitting: Emergency Medicine

## 2020-04-27 DIAGNOSIS — R519 Headache, unspecified: Secondary | ICD-10-CM | POA: Insufficient documentation

## 2020-04-27 DIAGNOSIS — R197 Diarrhea, unspecified: Secondary | ICD-10-CM | POA: Diagnosis not present

## 2020-04-27 DIAGNOSIS — Z20822 Contact with and (suspected) exposure to covid-19: Secondary | ICD-10-CM | POA: Diagnosis not present

## 2020-04-27 DIAGNOSIS — R112 Nausea with vomiting, unspecified: Secondary | ICD-10-CM | POA: Diagnosis not present

## 2020-04-27 LAB — CBC
HCT: 44.8 % (ref 36.0–46.0)
Hemoglobin: 15.8 g/dL — ABNORMAL HIGH (ref 12.0–15.0)
MCH: 31.8 pg (ref 26.0–34.0)
MCHC: 35.3 g/dL (ref 30.0–36.0)
MCV: 90.1 fL (ref 80.0–100.0)
Platelets: 304 10*3/uL (ref 150–400)
RBC: 4.97 MIL/uL (ref 3.87–5.11)
RDW: 11.8 % (ref 11.5–15.5)
WBC: 5.7 10*3/uL (ref 4.0–10.5)
nRBC: 0 % (ref 0.0–0.2)

## 2020-04-27 LAB — COMPREHENSIVE METABOLIC PANEL
ALT: 42 U/L (ref 0–44)
AST: 36 U/L (ref 15–41)
Albumin: 4.2 g/dL (ref 3.5–5.0)
Alkaline Phosphatase: 76 U/L (ref 38–126)
Anion gap: 9 (ref 5–15)
BUN: 7 mg/dL (ref 6–20)
CO2: 26 mmol/L (ref 22–32)
Calcium: 9 mg/dL (ref 8.9–10.3)
Chloride: 102 mmol/L (ref 98–111)
Creatinine, Ser: 0.83 mg/dL (ref 0.44–1.00)
GFR calc Af Amer: >60 mL/min (ref >60)
GFR calc non Af Amer: >60 mL/min (ref >60)
Glucose, Bld: 87 mg/dL (ref 70–99)
Potassium: 3.4 mmol/L — ABNORMAL LOW (ref 3.5–5.1)
Sodium: 137 mmol/L (ref 135–145)
Total Bilirubin: 1.4 mg/dL — ABNORMAL HIGH (ref 0.3–1.2)
Total Protein: 7.8 g/dL (ref 6.5–8.1)

## 2020-04-27 LAB — URINALYSIS, ROUTINE W REFLEX MICROSCOPIC
Bacteria, UA: NONE SEEN
Bilirubin Urine: NEGATIVE
Glucose, UA: NEGATIVE mg/dL
Ketones, ur: NEGATIVE mg/dL
Leukocytes,Ua: NEGATIVE
Nitrite: NEGATIVE
Protein, ur: NEGATIVE mg/dL
Specific Gravity, Urine: 1.023 (ref 1.005–1.030)
pH: 5 (ref 5.0–8.0)

## 2020-04-27 LAB — LIPASE, BLOOD: Lipase: 33 U/L (ref 11–51)

## 2020-04-27 LAB — POC URINE PREG, ED: Preg Test, Ur: NEGATIVE

## 2020-04-27 LAB — SARS CORONAVIRUS 2 BY RT PCR (HOSPITAL ORDER, PERFORMED IN ~~LOC~~ HOSPITAL LAB): SARS Coronavirus 2: NEGATIVE

## 2020-04-27 MED ORDER — CYCLOBENZAPRINE HCL 10 MG PO TABS
10.0000 mg | ORAL_TABLET | Freq: Once | ORAL | Status: AC
  Administered 2020-04-27: 10 mg via ORAL
  Filled 2020-04-27: qty 1

## 2020-04-27 MED ORDER — SODIUM CHLORIDE 0.9 % IV BOLUS
1000.0000 mL | Freq: Once | INTRAVENOUS | Status: AC
  Administered 2020-04-27: 1000 mL via INTRAVENOUS

## 2020-04-27 MED ORDER — IBUPROFEN 800 MG PO TABS
800.0000 mg | ORAL_TABLET | Freq: Once | ORAL | Status: AC
  Administered 2020-04-27: 800 mg via ORAL
  Filled 2020-04-27: qty 1

## 2020-04-27 MED ORDER — ONDANSETRON HCL 4 MG/2ML IJ SOLN
4.0000 mg | Freq: Once | INTRAMUSCULAR | Status: AC
  Administered 2020-04-27: 4 mg via INTRAVENOUS
  Filled 2020-04-27: qty 2

## 2020-04-27 MED ORDER — IBUPROFEN 800 MG PO TABS
800.0000 mg | ORAL_TABLET | Freq: Four times a day (QID) | ORAL | 0 refills | Status: DC | PRN
Start: 2020-04-27 — End: 2020-09-18

## 2020-04-27 MED ORDER — ONDANSETRON HCL 4 MG PO TABS
4.0000 mg | ORAL_TABLET | Freq: Three times a day (TID) | ORAL | 0 refills | Status: DC | PRN
Start: 2020-04-27 — End: 2020-07-17

## 2020-04-27 MED ORDER — SODIUM CHLORIDE 0.9% FLUSH
3.0000 mL | Freq: Once | INTRAVENOUS | Status: AC
  Administered 2020-04-27: 3 mL via INTRAVENOUS

## 2020-04-27 NOTE — ED Notes (Signed)
[employer] delivery arrived

## 2020-04-27 NOTE — ED Triage Notes (Signed)
Pt states she has had episodes of vomiting and diarrhea for past 4 days , pt also has c/o head ache with neck and back pain

## 2020-04-27 NOTE — Discharge Instructions (Addendum)
Drink plenty of fluids, bland diet as tolerated starting tomorrow.  Your blood work this evening was reassuring.  Your Covid test is pending.  You may review your results on MyChart.  Follow-up with your primary doctor for recheck.  Return emergency department for any worsening symptoms.

## 2020-04-27 NOTE — ED Notes (Signed)
Patient is being discharged from the Urgent Care and sent to the Emergency Department via private vehicle  . Per B Wurst , patient is in need of higher level of care to rule out meningitis . Patient is aware and verbalizes understanding of plan of care.  Vitals:   04/27/20 1342  BP: 107/75  Pulse: 95  Resp: 18  Temp: 98.6 F (37 C)  SpO2: 98%

## 2020-04-27 NOTE — ED Triage Notes (Signed)
Patient c/o severe headache, nausea, vomiting, diarrhea, body aches, and neck/back pain. Per patient photosensitivity. Denies any fevers or urinary symptoms. Per patient daughter had "stomach virus" Monday and was seen by PCP. Patient states her symptoms started on Wednesday and she thought she had a "stomach virus too" but symptoms are progressively getting worse.  Patient went to Urgent Care and was sent here to be checked for possible meningitis.

## 2020-04-29 NOTE — ED Provider Notes (Signed)
[facility] EMERGENCY DEPARTMENT Provider Note   CSN: [id number] Arrival date & time: 04/27/20  1356     History Chief Complaint  Patient presents with  . Headache    Amanda Mcmahon is a 24 y.o. female.  HPI      Amanda Mcmahon is a 24 y.o. female who presents to the Emergency Department complaining of gradual onset of frontal and temporal headache, nausea, vomiting, generalized body aches and occasional loose stools.  She states her symptoms began 3 days ago.  She was seen at a local urgent care and advised to come to the emergency department for further evaluation.  Her symptoms began 2 days after her daughter had similar symptoms.  She has taken Tylenol without relief.  She describes the headache as a dull sensation that waxes and wanes in severity primarily located at her temples.  She states that she had some neck pain associated with her headache at onset but neck pain has since resolved.  Fever has also resolved and she states that she has been able to tolerate liquids and some food today.  She denies abdominal pain, cough, shortness of breath.  She is requesting Covid test for her job.  She denies known Covid exposures.   Past Medical History:  Diagnosis Date  . Medical history non-contributory     Patient Active Problem List   Diagnosis Date Noted  . Normal labor 09/19/2016  . KNEE SPRAIN 07/28/2010    Past Surgical History:  Procedure Laterality Date  . NO PAST SURGERIES       OB History    Gravida  1   Para  1   Term  1   Preterm      AB      Living  1     SAB      TAB      Ectopic      Multiple  0   Live Births  1           Family History  Problem Relation Age of Onset  . Arthritis Mother   . Asthma Maternal Grandmother   . Cancer Maternal Grandmother        lung  . COPD Maternal Grandmother   . Depression Maternal Grandmother   . Diabetes Maternal Grandmother   . Hypertension Maternal Grandmother   . Stroke Maternal  Grandmother   . Hypertension Maternal Grandfather   . Diabetes Paternal Grandmother     Social History   Tobacco Use  . Smoking status: Never Smoker  . Smokeless tobacco: Never Used  Vaping Use  . Vaping Use: Never used  Substance Use Topics  . Alcohol use: No  . Drug use: Yes    Types: Marijuana    Comment: socially    Home Medications Prior to Admission medications   Medication Sig Start Date End Date Taking? Authorizing Provider  medroxyPROGESTERone (DEPO-PROVERA) 150 MG/ML injection Inject 1 mL (150 mg total) into the muscle once. 07/15/17 04/27/20 Yes Luking, Jonna Coup, MD  ibuprofen (ADVIL) 800 MG tablet Take 1 tablet (800 mg total) by mouth every 6 (six) hours as needed. Take with food 04/27/20   Gisell Buehrle, PA-C  ondansetron (ZOFRAN) 4 MG tablet Take 1 tablet (4 mg total) by mouth every 8 (eight) hours as needed for nausea or vomiting. 04/27/20   Montgomery Favor, PA-C    Allergies    Patient has no known allergies.  Review of Systems   Review of Systems  Constitutional: Positive for appetite change, chills and fever.  HENT: Negative for congestion, sore throat and trouble swallowing.   Eyes: Positive for photophobia.  Respiratory: Negative for cough, chest tightness, shortness of breath and wheezing.   Cardiovascular: Negative for chest pain.  Gastrointestinal: Positive for diarrhea, nausea and vomiting. Negative for abdominal pain.  Genitourinary: Negative for decreased urine volume and dysuria.  Musculoskeletal: Positive for myalgias and neck pain. Negative for arthralgias.  Skin: Negative for rash.  Neurological: Positive for headaches. Negative for dizziness, syncope, speech difficulty, weakness (Generalized weakness) and numbness.  Hematological: Negative for adenopathy.    Physical Exam Updated Vital Signs BP 113/64 (BP Location: Left Arm)   Pulse 93   Temp 98.2 F (36.8 C) (Oral)   Resp 15   Ht 5\' 4"  (1.626 m)   Wt 68 kg   LMP 04/15/2020  (Approximate)   SpO2 100%   BMI 25.75 kg/m   Physical Exam Vitals and nursing note reviewed.  Constitutional:      General: She is not in acute distress.    Appearance: Normal appearance. She is well-developed.  HENT:     Head: Normocephalic.  Eyes:     Extraocular Movements: Extraocular movements intact.     Pupils: Pupils are equal, round, and reactive to light.  Neck:     Thyroid: No thyromegaly.     Meningeal: Kernig's sign absent.  Cardiovascular:     Rate and Rhythm: Normal rate and regular rhythm.     Heart sounds: Normal heart sounds.  Pulmonary:     Effort: Pulmonary effort is normal.     Breath sounds: Normal breath sounds. No wheezing.  Abdominal:     Palpations: Abdomen is soft.     Tenderness: There is no abdominal tenderness. There is no guarding or rebound.  Musculoskeletal:        General: Normal range of motion.     Cervical back: Normal range of motion and neck supple.  Skin:    General: Skin is warm and dry.     Findings: No rash.  Neurological:     Mental Status: She is alert and oriented to person, place, and time.     GCS: GCS eye subscore is 4. GCS verbal subscore is 5. GCS motor subscore is 6.     Sensory: No sensory deficit.     Motor: No weakness.  Psychiatric:        Mood and Affect: Mood normal.        Speech: Speech normal.     ED Results / Procedures / Treatments   Labs (all labs ordered are listed, but only abnormal results are displayed) Labs Reviewed  COMPREHENSIVE METABOLIC PANEL - Abnormal; Notable for the following components:      Result Value   Potassium 3.4 (*)    Total Bilirubin 1.4 (*)    All other components within normal limits  CBC - Abnormal; Notable for the following components:   Hemoglobin 15.8 (*)    All other components within normal limits  URINALYSIS, ROUTINE W REFLEX MICROSCOPIC - Abnormal; Notable for the following components:   Hgb urine dipstick MODERATE (*)    All other components within normal limits    SARS CORONAVIRUS 2 BY RT PCR (HOSPITAL ORDER, PERFORMED IN [place] [facility])  LIPASE, BLOOD  POC URINE PREG, ED    EKG None  Radiology No results found.  Procedures Procedures (including critical care time)  Medications Ordered in ED Medications  sodium chloride  flush (NS) 0.9 % injection 3 mL (3 mLs Intravenous Given 04/27/20 1812)  ondansetron (ZOFRAN) injection 4 mg (4 mg Intravenous Given 04/27/20 1812)  sodium chloride 0.9 % bolus 1,000 mL (0 mLs Intravenous Stopped 04/27/20 1952)  ibuprofen (ADVIL) tablet 800 mg (800 mg Oral Given 04/27/20 1841)  cyclobenzaprine (FLEXERIL) tablet 10 mg (10 mg Oral Given 04/27/20 1841)    ED Course  I have reviewed the triage vital signs and the nursing notes.  Pertinent labs & imaging results that were available during my care of the patient were reviewed by me and considered in my medical decision making (see chart for details).    MDM Rules/Calculators/A&P                          Patient sent here from local urgent care for evaluation of possible meningitis.  Upon entering the room, patient has been drinking a soda and has eaten fast food that was delivered from [employer].  No active vomiting during ER stay.  She is well-appearing.  No meningeal signs on exam.  Labs are unremarkable.  No focal deficits on exam.  No ataxia.  On recheck patient has received fluids and medication for headache.  She states she is feeling better.  Symptoms are felt to be related to viral illness.  Doubt meningitis or SAH.  Patient appears appropriate for discharge home, agrees to close outpatient follow-up and return precautions were also discussed.   Final Clinical Impression(s) / ED Diagnoses Final diagnoses:  Bad headache  Nausea vomiting and diarrhea    Rx / DC Orders ED Discharge Orders         Ordered    ondansetron (ZOFRAN) 4 MG tablet  Every 8 hours PRN     Discontinue  Reprint     04/27/20 1958    ibuprofen (ADVIL) 800 MG tablet   Every 6 hours PRN     Discontinue  Reprint     04/27/20 1958           Kem Parkinson, PA-C 04/29/20 2258    Fredia Sorrow, MD 05/01/20 [phone]

## 2020-07-10 ENCOUNTER — Other Ambulatory Visit: Payer: Medicaid Other

## 2020-07-10 ENCOUNTER — Other Ambulatory Visit: Payer: Self-pay

## 2020-07-10 ENCOUNTER — Telehealth: Payer: Self-pay | Admitting: Family Medicine

## 2020-07-10 MED ORDER — MEDROXYPROGESTERONE ACETATE 150 MG/ML IM SUSP: 150.0000 mg | Freq: Once | INTRAMUSCULAR | 0 refills | Status: DC

## 2020-07-10 NOTE — Telephone Encounter (Signed)
Patient insurance changed to Medicaid so her appt on 9/8 had to be rescheduled to 9/15.  Will need Depo called in to [facility] on [address] prior to next weeks appt. (Scheduled on Karen's appts for contraception mgmt)

## 2020-07-10 NOTE — Telephone Encounter (Signed)
Please advise. Thank you

## 2020-07-10 NOTE — Telephone Encounter (Signed)
Please note that the prescription for Depo-Provera, given as directed, 0 refills

## 2020-07-10 NOTE — Telephone Encounter (Signed)
Refill sent in and pt is aware 

## 2020-07-17 ENCOUNTER — Other Ambulatory Visit: Payer: Self-pay

## 2020-07-17 ENCOUNTER — Encounter: Payer: Self-pay | Admitting: Family Medicine

## 2020-07-17 ENCOUNTER — Ambulatory Visit (INDEPENDENT_AMBULATORY_CARE_PROVIDER_SITE_OTHER): Payer: Medicaid Other | Admitting: Family Medicine

## 2020-07-17 VITALS — BP 104/82 | HR 112 | Temp 98.2°F | Wt 162.6 lb

## 2020-07-17 DIAGNOSIS — Z3042 Encounter for surveillance of injectable contraceptive: Secondary | ICD-10-CM | POA: Insufficient documentation

## 2020-07-17 DIAGNOSIS — Z0000 Encounter for general adult medical examination without abnormal findings: Secondary | ICD-10-CM | POA: Diagnosis not present

## 2020-07-17 DIAGNOSIS — Z308 Encounter for other contraceptive management: Secondary | ICD-10-CM

## 2020-07-17 MED ORDER — MEDROXYPROGESTERONE ACETATE 150 MG/ML IM SUSP
150.0000 mg | Freq: Once | INTRAMUSCULAR | Status: AC
  Administered 2020-07-17: 150 mg via INTRAMUSCULAR

## 2020-07-17 NOTE — Progress Notes (Signed)
   Patient ID: Amanda Mcmahon, female    DOB: April 03, 1996, 24 y.o.   MRN: [id number]   Chief Complaint  Patient presents with  . contraceptive management    Patient comes in today for her Depo-provera injection.    Subjective:  CC: contraception management  HPI Here for depo injection. Last Pap 08/25/2019. No concerns  Medical History Amanda Mcmahon has a past medical history of Medical history non-contributory.   Outpatient Encounter Medications as of 07/17/2020  Medication Sig  . ibuprofen (ADVIL) 800 MG tablet Take 1 tablet (800 mg total) by mouth every 6 (six) hours as needed. Take with food  . medroxyPROGESTERone (DEPO-PROVERA) 150 MG/ML injection Inject 1 mL (150 mg total) into the muscle once for 1 dose.  . [DISCONTINUED] ondansetron (ZOFRAN) 4 MG tablet Take 1 tablet (4 mg total) by mouth every 8 (eight) hours as needed for nausea or vomiting.  . [EXPIRED] medroxyPROGESTERone (DEPO-PROVERA) injection 150 mg    No facility-administered encounter medications on file as of 07/17/2020.     Review of Systems  All other systems reviewed and are negative.    Vitals BP 104/82   Pulse (!) 112   Temp 98.2 F (36.8 C)   Wt 162 lb 9.6 oz (73.8 kg)   SpO2 98%   BMI 27.91 kg/m   Objective:   Physical Exam Vitals and nursing note reviewed.  Constitutional:      Appearance: Normal appearance.  Cardiovascular:     Rate and Rhythm: Regular rhythm. Tachycardia present.     Heart sounds: Normal heart sounds.  Pulmonary:     Effort: Pulmonary effort is normal.     Breath sounds: Normal breath sounds.  Abdominal:     Palpations: Abdomen is soft.  Skin:    General: Skin is warm and dry.  Neurological:     Mental Status: She is alert and oriented to person, place, and time.  Psychiatric:        Mood and Affect: Mood normal.        Behavior: Behavior normal.      Assessment and Plan   1. Encounter for surveillance of injectable contraceptive - medroxyPROGESTERone  (DEPO-PROVERA) injection 150 mg  2. Encounter for health maintenance examination  Here today for Depo injection. Has not missed any doses. Upon questioning her on how long she has been on Depo she states since high school (around [age] years old).  Instructed her she will need to come off of Depo after this next cycle due to potential issues with her bone health. She has been on Depo for at least 10 years.   Agrees with plan of care discussed today. Understands warning signs to seek further care: signs of blood clots. Understands to follow-up in 2 months to discuss alternative methods of birth control and for her annual exam. Information given. She understands the importance to good bone health.  Amanda Bodo, FNP-C

## 2020-07-17 NOTE — Patient Instructions (Signed)
Follow-up in 2 months for annual physical and to discuss birth control options.      Contraception Choices Contraception, also called birth control, means things to use or ways to try not to get pregnant. Hormonal birth control This kind of birth control uses hormones. Here are some types of hormonal birth control:  A tube that is put under skin of the arm (implant). The tube can stay in for as long as 3 years.  Shots to get every 3 months (injections).  Pills to take every day (birth control pills).  A patch to change 1 time each week for 3 weeks (birth control patch). After that, the patch is taken off for 1 week.  A ring to put in the vagina. The ring is left in for 3 weeks. Then it is taken out of the vagina for 1 week. Then a new ring is put in.  Pills to take after unprotected sex (emergency birth control pills). Barrier birth control Here are some types of barrier birth control:  A thin covering that is put on the penis before sex (female condom). The covering is thrown away after sex.  A soft, loose covering that is put in the vagina before sex (female condom). The covering is thrown away after sex.  A rubber bowl that sits over the cervix (diaphragm). The bowl must be made for you. The bowl is put into the vagina before sex. The bowl is left in for 6-8 hours after sex. It is taken out within 24 hours.  A small, soft cup that fits over the cervix (cervical cap). The cup must be made for you. The cup can be left in for 6-8 hours after sex. It is taken out within 48 hours.  A sponge that is put into the vagina before sex. It must be left in for at least 6 hours after sex. It must be taken out within 30 hours. Then it is thrown away.  A chemical that kills or stops sperm from getting into the uterus (spermicide). It may be a pill, cream, jelly, or foam to put in the vagina. The chemical should be used at least 10-15 minutes before sex. IUD (intrauterine) birth control An IUD  is a small, T-shaped piece of plastic. It is put inside the uterus. There are two kinds:  Hormone IUD. This kind can stay in for 3-5 years.  Copper IUD. This kind can stay in for 10 years. Permanent birth control Here are some types of permanent birth control:  Surgery to block the fallopian tubes.  Having an insert put into each fallopian tube.  Surgery to tie off the tubes that carry sperm (vasectomy). Natural planning birth control Here are some types of natural planning birth control:  Not having sex on the days the woman could get pregnant.  Using a calendar: ? To keep track of the length of each period. ? To find out what days pregnancy can happen. ? To plan to not have sex on days when pregnancy can happen.  Watching for symptoms of ovulation and not having sex during ovulation. One way the woman can check for ovulation is to check her temperature.  Waiting to have sex until after ovulation. Summary  Contraception, also called birth control, means things to use or ways to try not to get pregnant.  Hormonal methods of birth control include implants, injections, pills, patches, vaginal rings, and emergency birth control pills.  Barrier methods of birth control can include female condoms, female  condoms, diaphragms, cervical caps, sponges, and spermicides.  There are two types of IUD (intrauterine device) birth control. An IUD can be put in a woman's uterus to prevent pregnancy for 3-5 years.  Permanent sterilization can be done through a procedure for males, females, or both.  Natural planning methods involve not having sex on the days when the woman could get pregnant. This information is not intended to replace advice given to you by your health care provider. Make sure you discuss any questions you have with your health care provider. Document Revised: 02/08/2019 Document Reviewed: 10/29/2016 Elsevier Patient Education  2020 [employer].

## 2020-09-18 ENCOUNTER — Other Ambulatory Visit: Payer: Self-pay

## 2020-09-18 ENCOUNTER — Ambulatory Visit (INDEPENDENT_AMBULATORY_CARE_PROVIDER_SITE_OTHER): Payer: Medicaid Other | Admitting: Family Medicine

## 2020-09-18 ENCOUNTER — Encounter: Payer: Self-pay | Admitting: Family Medicine

## 2020-09-18 VITALS — BP 114/76 | HR 99 | Temp 98.1°F | Ht 64.0 in | Wt 168.0 lb

## 2020-09-18 DIAGNOSIS — R0981 Nasal congestion: Secondary | ICD-10-CM

## 2020-09-18 DIAGNOSIS — Z0000 Encounter for general adult medical examination without abnormal findings: Secondary | ICD-10-CM

## 2020-09-18 DIAGNOSIS — Z13220 Encounter for screening for lipoid disorders: Secondary | ICD-10-CM

## 2020-09-18 DIAGNOSIS — Z23 Encounter for immunization: Secondary | ICD-10-CM

## 2020-09-18 NOTE — Progress Notes (Signed)
Subjective:    Patient ID: Amanda Mcmahon, female    DOB: 1995/12/22, 24 y.o.   MRN: [id number]  HPI The patient comes in today for a wellness visit.  Here today for wellness Denies any major setbacks States her energy level overall good Tries to eat healthy.  Is trying to keep her weight and check Works from home Does do some walking on a regular basis   She also uses Depo ongoing for her birth control recently she was advised to try different measures she would like to stay on Depo if possible  A review of their health history was completed.  A review of medications was also completed.  Any needed refills; birth control  Eating habits: health conscious  Falls/  MVA accidents in past few months: none  Regular exercise: none  Specialist pt sees on regular basis: dr  Emelda Fear - gyn  Preventative health issues were discussed.   Additional concerns: pt states she was told she needs to come off of depo provera.   Congestion for 2 days. Has not had a covid test since symptoms started.  Some head congestion a little bit drainage coughing no wheezing or difficulty breathing  Would like to get flu vaccine.     Review of Systems  Constitutional: Negative for activity change, appetite change and fatigue.  HENT: Negative for congestion and rhinorrhea.   Respiratory: Negative for cough and shortness of breath.   Cardiovascular: Negative for chest pain and leg swelling.  Gastrointestinal: Negative for abdominal pain and diarrhea.  Endocrine: Negative for polydipsia and polyphagia.  Skin: Negative for color change.  Neurological: Negative for dizziness and weakness.  Psychiatric/Behavioral: Negative for behavioral problems and confusion.       Objective:   Physical Exam Vitals reviewed.  Constitutional:      General: She is not in acute distress. HENT:     Head: Normocephalic and atraumatic.  Eyes:     General:        Right eye: No discharge.        Left eye: No  discharge.  Neck:     Trachea: No tracheal deviation.  Cardiovascular:     Rate and Rhythm: Normal rate and regular rhythm.     Heart sounds: Normal heart sounds. No murmur heard.   Pulmonary:     Effort: Pulmonary effort is normal. No respiratory distress.     Breath sounds: Normal breath sounds.  Lymphadenopathy:     Cervical: No cervical adenopathy.  Skin:    General: Skin is warm and dry.  Neurological:     Mental Status: She is alert.     Coordination: Coordination normal.  Psychiatric:        Behavior: Behavior normal.    With the congestion she denies high fever chills sweats wheezing difficulty breathing she relates head congestion and coughing symptoms over the past few days energy level okay not been around anyone with Covid as best she knows       Assessment & Plan:  1. Routine general medical examination at a health care facility Adult wellness-complete.wellness physical was conducted today. Importance of diet and exercise were discussed in detail.  In addition to this a discussion regarding safety was also covered. We also reviewed over immunizations and gave recommendations regarding current immunization needed for age.  In addition to this additional areas were also touched on including: Preventative health exams needed:  Colonoscopy not indicated currently  Patient was advised yearly wellness exam  -  Flu Vaccine QUAD 6+ mos PF IM (Fluarix Quad PF) - Lipid panel - Basic metabolic panel - CBC with Differential/Platelet  2. Need for vaccination Flu shot today - Flu Vaccine QUAD 6+ mos PF IM (Fluarix Quad PF)  3. Screening for lipid disorders Screening lipid - Lipid panel  4. Congestion of nasal sinus Viral syndrome check Covid test stay at home next couple days until test results are known - Novel Coronavirus, NAA ([facility])  As for birth control will communicate with gynecology how long she could stay on Depo

## 2020-09-19 LAB — CBC WITH DIFFERENTIAL/PLATELET
Basophils Absolute: 0 10*3/uL (ref 0.0–0.2)
Basos: 1 %
EOS (ABSOLUTE): 0.1 10*3/uL (ref 0.0–0.4)
Eos: 2 %
Hematocrit: 46.2 % (ref 34.0–46.6)
Hemoglobin: 15.6 g/dL (ref 11.1–15.9)
Immature Grans (Abs): 0 10*3/uL (ref 0.0–0.1)
Immature Granulocytes: 0 %
Lymphocytes Absolute: 2.2 10*3/uL (ref 0.7–3.1)
Lymphs: 25 %
MCH: 31.5 pg (ref 26.6–33.0)
MCHC: 33.8 g/dL (ref 31.5–35.7)
MCV: 93 fL (ref 79–97)
Monocytes Absolute: 1.1 10*3/uL — ABNORMAL HIGH (ref 0.1–0.9)
Monocytes: 13 %
Neutrophils Absolute: 5.2 10*3/uL (ref 1.4–7.0)
Neutrophils: 59 %
Platelets: 329 10*3/uL (ref 150–450)
RBC: 4.96 x10E6/uL (ref 3.77–5.28)
RDW: 12.3 % (ref 11.7–15.4)
WBC: 8.6 10*3/uL (ref 3.4–10.8)

## 2020-09-19 LAB — BASIC METABOLIC PANEL
BUN/Creatinine Ratio: 10 (ref 9–23)
BUN: 9 mg/dL (ref 6–20)
CO2: 20 mmol/L (ref 20–29)
Calcium: 9.6 mg/dL (ref 8.7–10.2)
Chloride: 104 mmol/L (ref 96–106)
Creatinine, Ser: 0.93 mg/dL (ref 0.57–1.00)
GFR calc Af Amer: 98 mL/min/{1.73_m2} (ref >59)
GFR calc non Af Amer: 85 mL/min/{1.73_m2} (ref >59)
Glucose: 78 mg/dL (ref 65–99)
Potassium: 4.4 mmol/L (ref 3.5–5.2)
Sodium: 139 mmol/L (ref 134–144)

## 2020-09-19 LAB — LIPID PANEL
Chol/HDL Ratio: 4.5 ratio — ABNORMAL HIGH (ref 0.0–4.4)
Cholesterol, Total: 171 mg/dL (ref 100–199)
HDL: 38 mg/dL — ABNORMAL LOW (ref >39)
LDL Chol Calc (NIH): 107 mg/dL — ABNORMAL HIGH (ref 0–99)
Triglycerides: 148 mg/dL (ref 0–149)
VLDL Cholesterol Cal: 26 mg/dL (ref 5–40)

## 2020-09-19 LAB — NOVEL CORONAVIRUS, NAA: SARS-CoV-2, NAA: NOT DETECTED

## 2020-09-19 LAB — SARS-COV-2, NAA 2 DAY TAT

## 2020-10-08 ENCOUNTER — Other Ambulatory Visit: Payer: Medicaid Other

## 2020-10-09 ENCOUNTER — Other Ambulatory Visit (INDEPENDENT_AMBULATORY_CARE_PROVIDER_SITE_OTHER): Payer: Medicaid Other | Admitting: *Deleted

## 2020-10-09 ENCOUNTER — Telehealth: Payer: Self-pay | Admitting: *Deleted

## 2020-10-09 ENCOUNTER — Other Ambulatory Visit: Payer: Self-pay | Admitting: *Deleted

## 2020-10-09 ENCOUNTER — Other Ambulatory Visit: Payer: Self-pay

## 2020-10-09 DIAGNOSIS — Z309 Encounter for contraceptive management, unspecified: Secondary | ICD-10-CM

## 2020-10-09 DIAGNOSIS — Z3042 Encounter for surveillance of injectable contraceptive: Secondary | ICD-10-CM | POA: Diagnosis not present

## 2020-10-09 MED ORDER — MEDROXYPROGESTERONE ACETATE 150 MG/ML IM SUSP
150.0000 mg | Freq: Once | INTRAMUSCULAR | Status: AC
  Administered 2020-10-09: 150 mg via INTRAMUSCULAR

## 2020-10-09 MED ORDER — MEDROXYPROGESTERONE ACETATE 150 MG/ML IM SUSP: 150.0000 mg | Freq: Once | INTRAMUSCULAR | 1 refills | Status: DC

## 2020-10-09 NOTE — Telephone Encounter (Signed)
So I did communicate with gynecology regarding this Amanda Mcmahon told me that they typically will utilize Provera for an extended length of time as long as the patient desires to use this and will recommend calcium and vitamin D on a daily basis Therefore several things (because Depo-Provera has a black box warning against long-term use because of osteoporosis we will need gynecology to consult with this patient before utilizing it long-term) 1.  Please inform patient that we will use Provera for the next 6 months but the patient will need to see gynecology in the spring Specifically gynecology will need to advise whether or not she can stay on Depo-Provera ongoing or if they recommend a different birth control Please let the patient know that it is necessary to get gynecology's formal opinion on this issue come springtime #2 go ahead with referral for springtime regarding this issue #3 May continue Depo-Provera every 3 months until she sees gynecology gets this opinion

## 2020-10-09 NOTE — Telephone Encounter (Signed)
Pt needs refill on depo provera. Ok per dr Lorin Picket to send in 2 refills total for 6 months. Refills sent and pt notified. Dr Lorin Picket spoke with gyn about her continuing depo provera.

## 2020-10-10 ENCOUNTER — Other Ambulatory Visit: Payer: Self-pay | Admitting: *Deleted

## 2020-10-10 DIAGNOSIS — Z3009 Encounter for other general counseling and advice on contraception: Secondary | ICD-10-CM

## 2020-10-10 NOTE — Telephone Encounter (Signed)
Discussed with pt. Pt verbalized understanding and referral put in.

## 2020-10-10 NOTE — Telephone Encounter (Signed)
Left message to return call 

## 2020-10-12 ENCOUNTER — Telehealth: Payer: Self-pay | Admitting: Family Medicine

## 2020-10-12 NOTE — Telephone Encounter (Signed)
This is in reference to previous note regarding ongoing use of Depo-Provera  Please put into the reminder file for the patient to have referral to gynecology regarding safety of ongoing use of Depo-Provera-because she has been on it for 10 years-this can be the [facility] May 2022

## 2020-10-28 ENCOUNTER — Other Ambulatory Visit: Payer: Self-pay

## 2020-10-28 ENCOUNTER — Ambulatory Visit (INDEPENDENT_AMBULATORY_CARE_PROVIDER_SITE_OTHER): Payer: Medicaid Other | Admitting: Advanced Practice Midwife

## 2020-10-28 ENCOUNTER — Encounter: Payer: Self-pay | Admitting: Advanced Practice Midwife

## 2020-10-28 VITALS — BP 121/77 | HR 101 | Ht 63.0 in | Wt 171.0 lb

## 2020-10-28 DIAGNOSIS — Z3042 Encounter for surveillance of injectable contraceptive: Secondary | ICD-10-CM

## 2020-10-28 NOTE — Progress Notes (Signed)
[facility] Visit  Patient name: Amanda Mcmahon MRN [id number]  Date of birth: 1996-04-07  CC & HPI:  Amanda Mcmahon is a 24 y.o.  female presenting today after being referred by PCP to discuss safety of long term depo provera use.  She has been using it for 10 years, happy with it. She does take vitamin D/calcium supplements. Is thinking about trying to get pregnant some time this year, not ready to make that decision yet.  Next shot due end of February.   Pertinent History Reviewed:  Medical & Surgical Hx:   Past Medical History:  Diagnosis Date   Medical history non-contributory    Past Surgical History:  Procedure Laterality Date   NO PAST SURGERIES     Family History  Problem Relation Age of Onset   Arthritis Mother    Asthma Maternal Grandmother    Cancer Maternal Grandmother        lung   COPD Maternal Grandmother    Depression Maternal Grandmother    Diabetes Maternal Grandmother    Hypertension Maternal Grandmother    Stroke Maternal Grandmother    Hypertension Maternal Grandfather    Diabetes Paternal Grandmother     Current Outpatient Medications:    medroxyPROGESTERone (DEPO-PROVERA) 150 MG/ML injection, Inject 1 mL (150 mg total) into the muscle once for 1 dose., Disp: 1 mL, Rfl: 1 Social History: Reviewed -  reports that she has never smoked. She has never used smokeless tobacco.  Review of Systems:   Constitutional: Negative for fever and chills Eyes: Negative for visual disturbances Respiratory: Negative for shortness of breath, dyspnea Cardiovascular: Negative for chest pain or palpitations  Gastrointestinal: Negative for vomiting, diarrhea and constipation; no abdominal pain Genitourinary: Negative for dysuria and urgency, vaginal irritation or itching Musculoskeletal: Negative for back pain, joint pain, myalgias  Neurological: Negative for dizziness and headaches    Objective Findings:    Physical Examination: Vitals:    10/28/20 1020  BP: 121/77  Pulse: (!) 101   General appearance - well appearing, and in no distress Mental status - alert, oriented to person, place, and time Chest:  Normal respiratory effort Heart - normal rate and regular rhythm Musculoskeletal:  Normal range of motion without pain Extremities:  No edema  Discussed possibility of (reversable) bone loss with long term depo use.      No results found for this or any previous visit (from the past 24 hour(s)).    Assessment & Plan:  A:   Contraception counseling P:  Dexa Scan ordered; if showing signs of bone loss, will DC depo and proceed w/another form of BC  Continue MVI     No follow-ups on file.  Jacklyn Shell CNM 10/28/2020 10:34 AM

## 2021-01-23 ENCOUNTER — Ambulatory Visit (INDEPENDENT_AMBULATORY_CARE_PROVIDER_SITE_OTHER): Payer: Medicaid Other | Admitting: Obstetrics & Gynecology

## 2021-01-23 ENCOUNTER — Telehealth: Payer: Self-pay

## 2021-01-23 ENCOUNTER — Encounter: Payer: Self-pay | Admitting: Obstetrics & Gynecology

## 2021-01-23 ENCOUNTER — Other Ambulatory Visit (HOSPITAL_COMMUNITY)
Admission: RE | Admit: 2021-01-23 | Discharge: 2021-01-23 | Disposition: A | Discharge status: Home / Self Care | Payer: Medicaid Other | Source: Ambulatory Visit | Attending: Obstetrics & Gynecology | Admitting: Obstetrics & Gynecology

## 2021-01-23 ENCOUNTER — Other Ambulatory Visit: Payer: Self-pay

## 2021-01-23 VITALS — BP 113/76 | HR 99 | Ht 63.0 in | Wt 175.5 lb

## 2021-01-23 DIAGNOSIS — Z0000 Encounter for general adult medical examination without abnormal findings: Secondary | ICD-10-CM | POA: Insufficient documentation

## 2021-01-23 DIAGNOSIS — Z01419 Encounter for gynecological examination (general) (routine) without abnormal findings: Secondary | ICD-10-CM | POA: Diagnosis not present

## 2021-01-23 DIAGNOSIS — Z3202 Encounter for pregnancy test, result negative: Secondary | ICD-10-CM

## 2021-01-23 LAB — POCT URINE PREGNANCY: Preg Test, Ur: NEGATIVE

## 2021-01-23 MED ORDER — MEDROXYPROGESTERONE ACETATE 150 MG/ML IM SUSP
150.0000 mg | INTRAMUSCULAR | 3 refills | Status: DC
Start: 2021-01-23 — End: 2021-09-22

## 2021-01-23 NOTE — Telephone Encounter (Signed)
Pt called wanting to know if she needs Nurse visit or Dr Visit? [facility] we referred her to and they approved her to stay on Depot Shot and wrote RX and she is wanting to make a Lab appt   Pt call back [phone]

## 2021-01-23 NOTE — Progress Notes (Signed)
Subjective:     Amanda Mcmahon is a 25 y.o. female here for a routine exam.  No LMP recorded. G1P1001 Birth Control Method:  Depo Provera Menstrual Calendar(currently): irregular  Current complaints: none.   Current acute medical issues:  none   Recent Gynecologic History No LMP recorded. Last Pap: 2017,  normal Last mammogram: n/a,    Past Medical History:  Diagnosis Date  . Medical history non-contributory     Past Surgical History:  Procedure Laterality Date  . NO PAST SURGERIES      OB History    Gravida  1   Para  1   Term  1   Preterm      AB      Living  1     SAB      IAB      Ectopic      Multiple  0   Live Births  1           Social History   Socioeconomic History  . Marital status: Single    Spouse name: Not on file  . Number of children: 1  . Years of education: Not on file  . Highest education level: Not on file  Occupational History  . Not on file  Tobacco Use  . Smoking status: Never Smoker  . Smokeless tobacco: Never Used  Vaping Use  . Vaping Use: Never used  Substance and Sexual Activity  . Alcohol use: Yes  . Drug use: Not Currently    Types: Marijuana    Comment: socially  . Sexual activity: Yes    Birth control/protection: Condom  Other Topics Concern  . Not on file  Social History Narrative  . Not on file   Social Determinants of Health   Financial Resource Strain: Low Risk   . Difficulty of Paying Living Expenses: Not very hard  Food Insecurity: No Food Insecurity  . Worried About Programme researcher, broadcasting/film/video in the Last Year: Never true  . Ran Out of Food in the Last Year: Never true  Transportation Needs: No Transportation Needs  . Lack of Transportation (Medical): No  . Lack of Transportation (Non-Medical): No  Physical Activity: Sufficiently Active  . Days of Exercise per Week: 3 days  . Minutes of Exercise per Session: 60 min  Stress: No Stress Concern Present  . Feeling of Stress : Not at all  Social  Connections: Socially Isolated  . Frequency of Communication with Friends and Family: Three times a week  . Frequency of Social Gatherings with Friends and Family: Three times a week  . Attends Religious Services: Never  . Active Member of Clubs or Organizations: No  . Attends Banker Meetings: Never  . Marital Status: Never married    Family History  Problem Relation Age of Onset  . Arthritis Mother   . Asthma Maternal Grandmother   . Cancer Maternal Grandmother        lung  . COPD Maternal Grandmother   . Depression Maternal Grandmother   . Diabetes Maternal Grandmother   . Hypertension Maternal Grandmother   . Stroke Maternal Grandmother   . Hypertension Maternal Grandfather   . Diabetes Paternal Grandmother      Current Outpatient Medications:  .  medroxyPROGESTERone (DEPO-PROVERA) 150 MG/ML injection, Inject 1 mL (150 mg total) into the muscle every 3 (three) months., Disp: 1 mL, Rfl: 3  Review of Systems  Review of Systems  Constitutional: Negative for fever, chills, weight  loss, malaise/fatigue and diaphoresis.  HENT: Negative for hearing loss, ear pain, nosebleeds, congestion, sore throat, neck pain, tinnitus and ear discharge.   Eyes: Negative for blurred vision, double vision, photophobia, pain, discharge and redness.  Respiratory: Negative for cough, hemoptysis, sputum production, shortness of breath, wheezing and stridor.   Cardiovascular: Negative for chest pain, palpitations, orthopnea, claudication, leg swelling and PND.  Gastrointestinal: negative for abdominal pain. Negative for heartburn, nausea, vomiting, diarrhea, constipation, blood in stool and melena.  Genitourinary: Negative for dysuria, urgency, frequency, hematuria and flank pain.  Musculoskeletal: Negative for myalgias, back pain, joint pain and falls.  Skin: Negative for itching and rash.  Neurological: Negative for dizziness, tingling, tremors, sensory change, speech change, focal  weakness, seizures, loss of consciousness, weakness and headaches.  Endo/Heme/Allergies: Negative for environmental allergies and polydipsia. Does not bruise/bleed easily.  Psychiatric/Behavioral: Negative for depression, suicidal ideas, hallucinations, memory loss and substance abuse. The patient is not nervous/anxious and does not have insomnia.        Objective:  Blood pressure 113/76, pulse 99, height 5\' 3"  (1.6 m), weight 175 lb 8 oz (79.6 kg).   Physical Exam  Vitals reviewed. Constitutional: She is oriented to person, place, and time. She appears well-developed and well-nourished.  HENT:  Head: Normocephalic and atraumatic.        Right Ear: External ear normal.  Left Ear: External ear normal.  Nose: Nose normal.  Mouth/Throat: Oropharynx is clear and moist.  Eyes: Conjunctivae and EOM are normal. Pupils are equal, round, and reactive to light. Right eye exhibits no discharge. Left eye exhibits no discharge. No scleral icterus.  Neck: Normal range of motion. Neck supple. No tracheal deviation present. No thyromegaly present.  Cardiovascular: Normal rate, regular rhythm, normal heart sounds and intact distal pulses.  Exam reveals no gallop and no friction rub.   No murmur heard. Respiratory: Effort normal and breath sounds normal. No respiratory distress. She has no wheezes. She has no rales. She exhibits no tenderness.  GI: Soft. Bowel sounds are normal. She exhibits no distension and no mass. There is no tenderness. There is no rebound and no guarding.  Genitourinary:  Breasts no masses skin changes or nipple changes bilaterally      Vulva is normal without lesions Vagina is pink moist without discharge Cervix normal in appearance and pap is done Uterus is normal size shape and contour Adnexa is negative with normal sized ovaries   Musculoskeletal: Normal range of motion. She exhibits no edema and no tenderness.  Neurological: She is alert and oriented to person, place, and  time. She has normal reflexes. She displays normal reflexes. No cranial nerve deficit. She exhibits normal muscle tone. Coordination normal.  Skin: Skin is warm and dry. No rash noted. No erythema. No pallor.  Psychiatric: She has a normal mood and affect. Her behavior is normal. Judgment and thought content normal.       Medications Ordered at today's visit: Meds ordered this encounter  Medications  . medroxyPROGESTERone (DEPO-PROVERA) 150 MG/ML injection    Sig: Inject 1 mL (150 mg total) into the muscle every 3 (three) months.    Dispense:  1 mL    Refill:  3    Other orders placed at today's visit: Orders Placed This Encounter  Procedures  . POCT urine pregnancy      Assessment:    Normal Gyn exam.    Plan:    Contraception: Depo-Provera injections. Follow up in: 3 years.  No follow-ups on file.

## 2021-01-23 NOTE — Telephone Encounter (Signed)
So #1 I take it from this message that the patient wants to do her shot here? #2 it would be fine to go ahead and set her up for this #3 if she is getting set up with this as a nurse visit here please take this message back to me so I can communicate with Dr.Eure Thank you

## 2021-01-24 NOTE — Telephone Encounter (Signed)
Left message to return call 

## 2021-01-24 NOTE — Telephone Encounter (Signed)
Per 10/28/20 it says depending on Bone Density will continue or d/c Depo if shows bone loss. [facility] since they have ordered the Dexa Scan and they will be determining status of birth control. Please advise. Thank you.

## 2021-01-26 NOTE — Telephone Encounter (Signed)
We can do the Depo-Provera here Follow standard protocol Also patient needs to follow through to get her bone density completed. If she is needs the test reordered please send Drenda Freeze a message letting you them know the patient never followed through with the test would like to have it reordered.  If any trouble let me know.  Thanks

## 2021-01-27 NOTE — Telephone Encounter (Signed)
Good afternoon. This patient is coming for a Depo Shot on 01/30/21. Does this patient still need a bone density test? Please advise. Thank you!

## 2021-01-27 NOTE — Telephone Encounter (Signed)
Pt contacted and verbalized understanding. Pt transferred up front to set up nurse visit.

## 2021-01-29 ENCOUNTER — Other Ambulatory Visit: Payer: Self-pay | Admitting: Advanced Practice Midwife

## 2021-01-29 DIAGNOSIS — Z9189 Other specified personal risk factors, not elsewhere classified: Secondary | ICD-10-CM

## 2021-01-29 LAB — CYTOLOGY - PAP
Adequacy: ABSENT
Chlamydia: NEGATIVE
Comment: NEGATIVE
Comment: NEGATIVE
Comment: NEGATIVE
Comment: NORMAL
Diagnosis: NEGATIVE
HPV 16: NEGATIVE
HPV 18 / 45: NEGATIVE
High risk HPV: POSITIVE — AB
Neisseria Gonorrhea: NEGATIVE

## 2021-01-29 NOTE — Telephone Encounter (Signed)
Mychart message sent to pt letting her know that I reordered her bone scan and she needs to get it before her June depo shot (they will call her for an appt).

## 2021-01-29 NOTE — Progress Notes (Signed)
dexa scan re ordered (per PCP, pt did not follow up).  Mychart message sent to pt.

## 2021-01-30 ENCOUNTER — Other Ambulatory Visit: Payer: Self-pay

## 2021-01-30 ENCOUNTER — Other Ambulatory Visit (INDEPENDENT_AMBULATORY_CARE_PROVIDER_SITE_OTHER): Payer: Medicaid Other | Admitting: *Deleted

## 2021-01-30 DIAGNOSIS — Z3042 Encounter for surveillance of injectable contraceptive: Secondary | ICD-10-CM | POA: Diagnosis not present

## 2021-01-30 LAB — POCT URINE PREGNANCY: Preg Test, Ur: NEGATIVE

## 2021-01-30 MED ORDER — MEDROXYPROGESTERONE ACETATE 150 MG/ML IM SUSP
150.0000 mg | Freq: Once | INTRAMUSCULAR | Status: AC
  Administered 2021-01-30: 150 mg via INTRAMUSCULAR

## 2021-02-06 ENCOUNTER — Other Ambulatory Visit: Payer: Self-pay

## 2021-02-06 ENCOUNTER — Ambulatory Visit (HOSPITAL_COMMUNITY)
Admission: RE | Admit: 2021-02-06 | Discharge: 2021-02-06 | Disposition: A | Discharge status: Home / Self Care | Payer: Medicaid Other | Source: Ambulatory Visit | Attending: Advanced Practice Midwife | Admitting: Advanced Practice Midwife

## 2021-02-06 DIAGNOSIS — M85852 Other specified disorders of bone density and structure, left thigh: Secondary | ICD-10-CM | POA: Diagnosis not present

## 2021-02-06 DIAGNOSIS — Z3042 Encounter for surveillance of injectable contraceptive: Secondary | ICD-10-CM | POA: Diagnosis not present

## 2021-02-09 ENCOUNTER — Encounter: Payer: Self-pay | Admitting: Family Medicine

## 2021-02-09 DIAGNOSIS — M8580 Other specified disorders of bone density and structure, unspecified site: Secondary | ICD-10-CM

## 2021-02-09 HISTORY — DX: Other specified disorders of bone density and structure, unspecified site: M85.80

## 2021-02-14 DIAGNOSIS — H5213 Myopia, bilateral: Secondary | ICD-10-CM | POA: Diagnosis not present

## 2021-04-22 ENCOUNTER — Other Ambulatory Visit: Payer: Medicaid Other

## 2021-07-03 ENCOUNTER — Ambulatory Visit
Admission: EM | Admit: 2021-07-03 | Discharge: 2021-07-03 | Disposition: A | Discharge status: Home / Self Care | Payer: Medicaid Other | Attending: Emergency Medicine | Admitting: Emergency Medicine

## 2021-07-03 ENCOUNTER — Other Ambulatory Visit: Payer: Self-pay

## 2021-07-03 ENCOUNTER — Encounter: Payer: Self-pay | Admitting: Emergency Medicine

## 2021-07-03 DIAGNOSIS — G43009 Migraine without aura, not intractable, without status migrainosus: Secondary | ICD-10-CM

## 2021-07-03 DIAGNOSIS — Z1152 Encounter for screening for COVID-19: Secondary | ICD-10-CM

## 2021-07-03 MED ORDER — DEXAMETHASONE SODIUM PHOSPHATE 10 MG/ML IJ SOLN
10.0000 mg | Freq: Once | INTRAMUSCULAR | Status: AC
  Administered 2021-07-03: 10 mg via INTRAMUSCULAR

## 2021-07-03 MED ORDER — KETOROLAC TROMETHAMINE 30 MG/ML IJ SOLN
30.0000 mg | Freq: Once | INTRAMUSCULAR | Status: AC
Start: 2021-07-03 — End: 2021-07-03
  Administered 2021-07-03: 30 mg via INTRAMUSCULAR

## 2021-07-03 NOTE — Discharge Instructions (Signed)
Migraine cocktail given in office Rest and drink plenty of fluids Use OTC medications as needed for symptomatic relief Follow up with PCP if symptoms persists Return or go to the ER if you have any new or worsening symptoms such as fever, chills, nausea, vomiting, chest pain, shortness of breath, cough, vision changes, worsening headache despite treatment, slurred speech, facial asymmetry, weakness in arms or legs, etc... 

## 2021-07-03 NOTE — ED Provider Notes (Signed)
[facility]   [id number] 07/03/21 Arrival Time: 1615  [id number]  SUBJECTIVE:  Amanda Mcmahon is a 25 y.o. female who complains of persistent migraine, headache, and throat feels swollen x 3 days.  Denies a precipitating event, or recent head trauma.  Patient localizes her pain to the temples.  Describes the pain as constant and throbbing in character.  Patient has tried OTC medication without relief. Symptoms are made worse with light.  Reports similar symptoms in the past.  This is not the worst headache of their life.  Patient denies fever, chills, nausea, vomiting, aura, rhinorrhea, watery eyes, chest pain, SOB, abdominal pain, weakness, numbness or tingling, slurred speech.     ROS: As per HPI.  All other pertinent ROS negative.     Past Medical History:  Diagnosis Date   Medical history non-contributory    Osteopenia 02/09/2021   Discontinue provera, recheck bone density in 2024   Past Surgical History:  Procedure Laterality Date   NO PAST SURGERIES     No Known Allergies No current facility-administered medications on file prior to encounter.   Current Outpatient Medications on File Prior to Encounter  Medication Sig Dispense Refill   medroxyPROGESTERone (DEPO-PROVERA) 150 MG/ML injection Inject 1 mL (150 mg total) into the muscle every 3 (three) months. 1 mL 3   Social History   Socioeconomic History   Marital status: Single    Spouse name: Not on file   Number of children: 1   Years of education: Not on file   Highest education level: Not on file  Occupational History   Not on file  Tobacco Use   Smoking status: Never   Smokeless tobacco: Never  Vaping Use   Vaping Use: Never used  Substance and Sexual Activity   Alcohol use: Yes   Drug use: Not Currently    Types: Marijuana    Comment: socially   Sexual activity: Yes    Birth control/protection: Condom  Other Topics Concern   Not on file  Social History Narrative   Not on file   Social  Determinants of Health   Financial Resource Strain: Low Risk    Difficulty of Paying Living Expenses: Not very hard  Food Insecurity: No Food Insecurity   Worried About Programme researcher, broadcasting/film/video in the Last Year: Never true   Ran Out of Food in the Last Year: Never true  Transportation Needs: No Transportation Needs   Lack of Transportation (Medical): No   Lack of Transportation (Non-Medical): No  Physical Activity: Sufficiently Active   Days of Exercise per Week: 3 days   Minutes of Exercise per Session: 60 min  Stress: No Stress Concern Present   Feeling of Stress : Not at all  Social Connections: Socially Isolated   Frequency of Communication with Friends and Family: Three times a week   Frequency of Social Gatherings with Friends and Family: Three times a week   Attends Religious Services: Never   Active Member of Clubs or Organizations: No   Attends Banker Meetings: Never   Marital Status: Never married  Catering manager Violence: Not At Risk   Fear of Current or Ex-Partner: No   Emotionally Abused: No   Physically Abused: No   Sexually Abused: No   Family History  Problem Relation Age of Onset   Arthritis Mother    Asthma Maternal Grandmother    Cancer Maternal Grandmother        lung   COPD Maternal Grandmother  Depression Maternal Grandmother    Diabetes Maternal Grandmother    Hypertension Maternal Grandmother    Stroke Maternal Grandmother    Hypertension Maternal Grandfather    Diabetes Paternal Grandmother     OBJECTIVE:  Vitals:   07/03/21 1634  BP: 115/76  Pulse: 88  Resp: 18  Temp: 98.6 F (37 C)  TempSrc: Oral  SpO2: 97%    General appearance: alert; no distress Eyes: PERRLA; EOMI HENT: normocephalic; atraumatic Neck: supple with FROM Lungs: clear to auscultation bilaterally Heart: regular rate and rhythm.  Extremities: no edema; symmetrical with no gross deformities Skin: warm and dry Neurologic: CN 2-12 grossly intact; normal  gait; strength and sensation intact bilaterally about the upper and lower extremities Psychological: alert and cooperative; normal mood and affect   ASSESSMENT & PLAN:  1. Encounter for screening for COVID-19   2. Migraine without aura and without status migrainosus, not intractable     Meds ordered this encounter  Medications   ketorolac (TORADOL) 30 MG/ML injection 30 mg   dexamethasone (DECADRON) injection 10 mg    Migraine cocktail given in office Rest and drink plenty of fluids Use OTC medications as needed for symptomatic relief Follow up with PCP if symptoms persists Return or go to the ER if you have any new or worsening symptoms such as fever, chills, nausea, vomiting, chest pain, shortness of breath, cough, vision changes, worsening headache despite treatment, slurred speech, facial asymmetry, weakness in arms or legs, etc...  Reviewed expectations re: course of current medical issues. Questions answered. Outlined signs and symptoms indicating need for more acute intervention. Patient verbalized understanding. After Visit Summary given.    Rennis Harding, PA-C 07/03/21 1653

## 2021-07-03 NOTE — ED Triage Notes (Signed)
Headache and throat feels swollen x 3 days.  Neg at home covid test.

## 2021-07-04 LAB — NOVEL CORONAVIRUS, NAA: SARS-CoV-2, NAA: NOT DETECTED

## 2021-07-04 LAB — SARS-COV-2, NAA 2 DAY TAT

## 2021-08-30 ENCOUNTER — Emergency Department (HOSPITAL_COMMUNITY): Payer: BLUE CROSS/BLUE SHIELD

## 2021-08-30 ENCOUNTER — Ambulatory Visit (HOSPITAL_COMMUNITY)
Admission: EM | Admit: 2021-08-30 | Discharge: 2021-08-31 | Disposition: A | Discharge status: Home / Self Care | Payer: BLUE CROSS/BLUE SHIELD | Attending: Obstetrics & Gynecology | Admitting: Obstetrics & Gynecology

## 2021-08-30 ENCOUNTER — Other Ambulatory Visit: Payer: Self-pay

## 2021-08-30 ENCOUNTER — Encounter (HOSPITAL_COMMUNITY): Payer: Self-pay

## 2021-08-30 DIAGNOSIS — Z3A01 Less than 8 weeks gestation of pregnancy: Secondary | ICD-10-CM | POA: Diagnosis not present

## 2021-08-30 DIAGNOSIS — O26891 Other specified pregnancy related conditions, first trimester: Secondary | ICD-10-CM | POA: Diagnosis not present

## 2021-08-30 DIAGNOSIS — Z833 Family history of diabetes mellitus: Secondary | ICD-10-CM | POA: Diagnosis not present

## 2021-08-30 DIAGNOSIS — Z20822 Contact with and (suspected) exposure to covid-19: Secondary | ICD-10-CM | POA: Insufficient documentation

## 2021-08-30 DIAGNOSIS — R102 Pelvic and perineal pain: Secondary | ICD-10-CM

## 2021-08-30 DIAGNOSIS — Z8261 Family history of arthritis: Secondary | ICD-10-CM | POA: Diagnosis not present

## 2021-08-30 DIAGNOSIS — Z801 Family history of malignant neoplasm of trachea, bronchus and lung: Secondary | ICD-10-CM | POA: Insufficient documentation

## 2021-08-30 DIAGNOSIS — O00101 Right tubal pregnancy without intrauterine pregnancy: Secondary | ICD-10-CM | POA: Insufficient documentation

## 2021-08-30 DIAGNOSIS — O009 Unspecified ectopic pregnancy without intrauterine pregnancy: Secondary | ICD-10-CM | POA: Diagnosis not present

## 2021-08-30 DIAGNOSIS — Z823 Family history of stroke: Secondary | ICD-10-CM | POA: Diagnosis not present

## 2021-08-30 DIAGNOSIS — Z793 Long term (current) use of hormonal contraceptives: Secondary | ICD-10-CM | POA: Diagnosis not present

## 2021-08-30 DIAGNOSIS — Z8249 Family history of ischemic heart disease and other diseases of the circulatory system: Secondary | ICD-10-CM | POA: Diagnosis not present

## 2021-08-30 LAB — COMPREHENSIVE METABOLIC PANEL WITH GFR
ALT: 25 U/L (ref 0–44)
AST: 19 U/L (ref 15–41)
Albumin: 3.8 g/dL (ref 3.5–5.0)
Alkaline Phosphatase: 94 U/L (ref 38–126)
Anion gap: 7 (ref 5–15)
BUN: 5 mg/dL — ABNORMAL LOW (ref 6–20)
CO2: 21 mmol/L — ABNORMAL LOW (ref 22–32)
Calcium: 8.9 mg/dL (ref 8.9–10.3)
Chloride: 106 mmol/L (ref 98–111)
Creatinine, Ser: 0.66 mg/dL (ref 0.44–1.00)
GFR, Estimated: >60 mL/min
Glucose, Bld: 109 mg/dL — ABNORMAL HIGH (ref 70–99)
Potassium: 3.8 mmol/L (ref 3.5–5.1)
Sodium: 134 mmol/L — ABNORMAL LOW (ref 135–145)
Total Bilirubin: 1.4 mg/dL — ABNORMAL HIGH (ref 0.3–1.2)
Total Protein: 7 g/dL (ref 6.5–8.1)

## 2021-08-30 LAB — CBC WITH DIFFERENTIAL/PLATELET
Abs Immature Granulocytes: 0.1 10*3/uL — ABNORMAL HIGH (ref 0.00–0.07)
Basophils Absolute: 0 10*3/uL (ref 0.0–0.1)
Basophils Relative: 0 %
Eosinophils Absolute: 0 10*3/uL (ref 0.0–0.5)
Eosinophils Relative: 0 %
HCT: 40.3 % (ref 36.0–46.0)
Hemoglobin: 14.1 g/dL (ref 12.0–15.0)
Immature Granulocytes: 1 %
Lymphocytes Relative: 9 %
Lymphs Abs: 1.2 10*3/uL (ref 0.7–4.0)
MCH: 32.6 pg (ref 26.0–34.0)
MCHC: 35 g/dL (ref 30.0–36.0)
MCV: 93.3 fL (ref 80.0–100.0)
Monocytes Absolute: 0.9 10*3/uL (ref 0.1–1.0)
Monocytes Relative: 7 %
Neutro Abs: 11.6 10*3/uL — ABNORMAL HIGH (ref 1.7–7.7)
Neutrophils Relative %: 83 %
Platelets: 303 10*3/uL (ref 150–400)
RBC: 4.32 MIL/uL (ref 3.87–5.11)
RDW: 12 % (ref 11.5–15.5)
WBC: 13.9 10*3/uL — ABNORMAL HIGH (ref 4.0–10.5)
nRBC: 0 % (ref 0.0–0.2)

## 2021-08-30 LAB — URINALYSIS, ROUTINE W REFLEX MICROSCOPIC
Bilirubin Urine: NEGATIVE
Glucose, UA: NEGATIVE mg/dL
Hgb urine dipstick: NEGATIVE
Ketones, ur: 20 mg/dL — AB
Leukocytes,Ua: NEGATIVE
Nitrite: NEGATIVE
Protein, ur: NEGATIVE mg/dL
Specific Gravity, Urine: 1.02 (ref 1.005–1.030)
pH: 6 (ref 5.0–8.0)

## 2021-08-30 LAB — WET PREP, GENITAL
Clue Cells Wet Prep HPF POC: NONE SEEN
Sperm: NONE SEEN
Trich, Wet Prep: NONE SEEN
WBC, Wet Prep HPF POC: <10 — AB
Yeast Wet Prep HPF POC: NONE SEEN

## 2021-08-30 LAB — HCG, QUANTITATIVE, PREGNANCY: hCG, Beta Chain, Quant, S: 602 m[IU]/mL — ABNORMAL HIGH

## 2021-08-30 LAB — PREGNANCY, URINE: Preg Test, Ur: POSITIVE — AB

## 2021-08-30 MED ORDER — SODIUM CHLORIDE 0.9 % IV SOLN
6.2500 mg | Freq: Four times a day (QID) | INTRAVENOUS | Status: DC | PRN
  Administered 2021-08-30: 6.25 mg via INTRAVENOUS
  Filled 2021-08-30: qty 0.25

## 2021-08-30 MED ORDER — MORPHINE SULFATE (PF) 4 MG/ML IV SOLN
4.0000 mg | Freq: Once | INTRAVENOUS | Status: AC
  Administered 2021-08-30: 4 mg via INTRAVENOUS
  Filled 2021-08-30: qty 1

## 2021-08-30 MED ORDER — PROMETHAZINE HCL 25 MG/ML IJ SOLN
INTRAMUSCULAR | Status: AC
  Filled 2021-08-30: qty 1

## 2021-08-30 NOTE — ED Provider Notes (Signed)
[facility] EMERGENCY DEPARTMENT Provider Note   CSN: [id number] Arrival date & time: 08/30/21  1941     History Chief Complaint  Patient presents with   Abdominal Pain    Amanda Mcmahon is a 25 y.o. female who was currently G2, P1 who just found today that she is pregnant by home pregnancy test presenting with complaints of low pelvic cramping pain which radiates into her back, nausea without emesis.  She was on Depo injections which were discontinued in April as she was discovered to have osteopenia.  She has noticed some breast tenderness, along with some abdominal distention and therefore took a home pregnancy test when she developed pain today and was surprised to discover she was positive.  She denies vaginal discharge or vaginal bleeding, doubts risk factors for STDs.  Her LMP was approximately 2 months ago, but states she has been historically very regular with her periods since coming off of the Depo Provera.  No other significant past medical history, no surgical history.  The history is provided by the patient.      Past Medical History:  Diagnosis Date   Medical history non-contributory    Osteopenia 02/09/2021   Discontinue provera, recheck bone density in 2024    Patient Active Problem List   Diagnosis Date Noted   Osteopenia 02/09/2021   Encounter for health maintenance examination 07/17/2020   Encounter for surveillance of injectable contraceptive 07/17/2020    Past Surgical History:  Procedure Laterality Date   NO PAST SURGERIES       OB History     Gravida  2   Para  1   Term  1   Preterm      AB      Living  1      SAB      IAB      Ectopic      Multiple  0   Live Births  1           Family History  Problem Relation Age of Onset   Arthritis Mother    Asthma Maternal Grandmother    Cancer Maternal Grandmother        lung   COPD Maternal Grandmother    Depression Maternal Grandmother    Diabetes Maternal Grandmother     Hypertension Maternal Grandmother    Stroke Maternal Grandmother    Hypertension Maternal Grandfather    Diabetes Paternal Grandmother     Social History   Tobacco Use   Smoking status: Never   Smokeless tobacco: Never  Vaping Use   Vaping Use: Never used  Substance Use Topics   Alcohol use: Yes   Drug use: Not Currently    Types: Marijuana    Comment: socially    Home Medications Prior to Admission medications   Medication Sig Start Date End Date Taking? Authorizing Provider  medroxyPROGESTERone (DEPO-PROVERA) 150 MG/ML injection Inject 1 mL (150 mg total) into the muscle every 3 (three) months. 01/23/21   Lazaro Arms, MD    Allergies    Patient has no known allergies.  Review of Systems   Review of Systems  Constitutional:  Negative for chills and fever.  HENT:  Negative for congestion and sore throat.   Eyes: Negative.   Respiratory:  Negative for chest tightness and shortness of breath.   Cardiovascular:  Negative for chest pain.  Gastrointestinal:  Positive for nausea. Negative for abdominal pain, constipation, diarrhea and vomiting.  Genitourinary:  Positive for  pelvic pain. Negative for dysuria.  Musculoskeletal:  Negative for arthralgias, joint swelling and neck pain.  Skin: Negative.  Negative for rash and wound.  Neurological:  Negative for dizziness, weakness, light-headedness, numbness and headaches.  Psychiatric/Behavioral: Negative.    All other systems reviewed and are negative.  Physical Exam Updated Vital Signs BP 118/79   Pulse 85   Temp 98.6 F (37 C) (Oral)   Resp 16   Ht 5\' 3"  (1.6 m)   Wt 86.2 kg   SpO2 99%   BMI 33.66 kg/m   Physical Exam Vitals and nursing note reviewed. Exam conducted with a chaperone present.  Constitutional:      Appearance: She is well-developed.  HENT:     Head: Normocephalic and atraumatic.  Eyes:     Conjunctiva/sclera: Conjunctivae normal.  Cardiovascular:     Rate and Rhythm: Normal rate and regular  rhythm.     Heart sounds: Normal heart sounds.  Pulmonary:     Effort: Pulmonary effort is normal.     Breath sounds: Normal breath sounds. No wheezing.  Abdominal:     General: Bowel sounds are normal.     Palpations: Abdomen is soft.     Tenderness: There is no abdominal tenderness.  Genitourinary:    Cervix: No cervical motion tenderness.     Uterus: Tender.      Adnexa: Right adnexa normal and left adnexa normal.     Comments: Scant off white vaginal discharge. No bleeding. No adnexal tenderness, midline uterus ttp.  Musculoskeletal:        General: Normal range of motion.     Cervical back: Normal range of motion.  Skin:    General: Skin is warm and dry.  Neurological:     Mental Status: She is alert.    ED Results / Procedures / Treatments   Labs (all labs ordered are listed, but only abnormal results are displayed) Labs Reviewed  WET PREP, GENITAL - Abnormal; Notable for the following components:      Result Value   WBC, Wet Prep HPF POC <10 (*)    All other components within normal limits  URINALYSIS, ROUTINE W REFLEX MICROSCOPIC - Abnormal; Notable for the following components:   Ketones, ur 20 (*)    All other components within normal limits  PREGNANCY, URINE - Abnormal; Notable for the following components:   Preg Test, Ur POSITIVE (*)    All other components within normal limits  CBC WITH DIFFERENTIAL/PLATELET - Abnormal; Notable for the following components:   WBC 13.9 (*)    Neutro Abs 11.6 (*)    Abs Immature Granulocytes 0.10 (*)    All other components within normal limits  COMPREHENSIVE METABOLIC PANEL - Abnormal; Notable for the following components:   Sodium 134 (*)    CO2 21 (*)    Glucose, Bld 109 (*)    BUN 5 (*)    Total Bilirubin 1.4 (*)    All other components within normal limits  HCG, QUANTITATIVE, PREGNANCY - Abnormal; Notable for the following components:   hCG, Beta Chain, Quant, S 602 (*)    All other components within normal limits   GC/CHLAMYDIA PROBE AMP ([place]) NOT AT [facility]    EKG None  Radiology [facility] OB Comp < 14 Wks  Result Date: 08/30/2021 CLINICAL DATA:  Pregnant patient with pelvic pain.  Beta HCG 602 EXAM: OBSTETRIC <14 WK 09/01/2021 AND TRANSVAGINAL OB US TECHNIQUE: Both transabdominal and transvaginal ultrasound examinations were performed for  complete evaluation of the gestation as well as the maternal uterus, adnexal regions, and pelvic cul-de-sac. Transvaginal technique was performed to assess early pregnancy. COMPARISON:  None this pregnancy FINDINGS: Intrauterine gestational sac: Complex fluid and echogenic debris in the endometrial canal but no gestational sac. Yolk sac:  Not Visualized. Embryo:  Not Visualized. Cardiac Activity: Not Visualized. Maternal uterus/adnexae: There is complex fluid and echogenic debris within the endometrial canal but no gestational sac. Large amount of complex free fluid consistent with blood products in the pelvis. There is echogenic material adjacent to the right ovary without well-defined adnexal mass. Cystic structure in the right ovary with peripheral flow measuring 1.6 cm is favored to represent a corpus luteal or hemorrhagic cyst. The left ovary appears normal. Ovarian blood flow is seen. IMPRESSION: 1. Large amount of blood in the pelvis, with echogenic material adjacent to the right ovary. While there is no definitive adnexal mass, findings are highly suspicious for ruptured ectopic pregnancy on the right. 2. Right ovarian cyst with peripheral blood flow measuring 1.6 cm is favored to represent a corpus luteal or hemorrhagic cyst rather than an ovarian ectopic. Ruptured hemorrhagic cyst as cause of pelvic hemorrhage is not entirely excluded, however in this setting ruptured ectopic is favored. 3. Complex fluid and echogenic debris in the endometrial canal without intrauterine gestational sac. Critical Value/emergent results were called by telephone at the time of interpretation on  08/30/2021 at 11:39 pm to provider Kahlia Lagunes , who verbally acknowledged these results. Electronically Signed   By: Narda Rutherford M.D.   On: 08/30/2021 23:41   US OB Transvaginal  Result Date: 08/30/2021 CLINICAL DATA:  Pregnant patient with pelvic pain.  Beta HCG 602 EXAM: OBSTETRIC <14 WK [place] AND TRANSVAGINAL OB US TECHNIQUE: Both transabdominal and transvaginal ultrasound examinations were performed for complete evaluation of the gestation as well as the maternal uterus, adnexal regions, and pelvic cul-de-sac. Transvaginal technique was performed to assess early pregnancy. COMPARISON:  None this pregnancy FINDINGS: Intrauterine gestational sac: Complex fluid and echogenic debris in the endometrial canal but no gestational sac. Yolk sac:  Not Visualized. Embryo:  Not Visualized. Cardiac Activity: Not Visualized. Maternal uterus/adnexae: There is complex fluid and echogenic debris within the endometrial canal but no gestational sac. Large amount of complex free fluid consistent with blood products in the pelvis. There is echogenic material adjacent to the right ovary without well-defined adnexal mass. Cystic structure in the right ovary with peripheral flow measuring 1.6 cm is favored to represent a corpus luteal or hemorrhagic cyst. The left ovary appears normal. Ovarian blood flow is seen. IMPRESSION: 1. Large amount of blood in the pelvis, with echogenic material adjacent to the right ovary. While there is no definitive adnexal mass, findings are highly suspicious for ruptured ectopic pregnancy on the right. 2. Right ovarian cyst with peripheral blood flow measuring 1.6 cm is favored to represent a corpus luteal or hemorrhagic cyst rather than an ovarian ectopic. Ruptured hemorrhagic cyst as cause of pelvic hemorrhage is not entirely excluded, however in this setting ruptured ectopic is favored. 3. Complex fluid and echogenic debris in the endometrial canal without intrauterine gestational sac. Critical  Value/emergent results were called by telephone at the time of interpretation on 08/30/2021 at 11:39 pm to provider Arrietty Dercole , who verbally acknowledged these results. Electronically Signed   By: Narda Rutherford M.D.   On: 08/30/2021 23:41    Procedures Procedures   Medications Ordered in ED Medications  promethazine (PHENERGAN) 6.25 mg in  sodium chloride 0.9 % 50 mL IVPB (6.25 mg Intravenous New Bag/Given 08/30/21 2311)  morphine 4 MG/ML injection 4 mg (4 mg Intravenous Given 08/30/21 2310)    ED Course  I have reviewed the triage vital signs and the nursing notes.  Pertinent labs & imaging results that were available during my care of the patient were reviewed by me and considered in my medical decision making (see chart for details).  Clinical Course as of 08/30/21 2349  Sat Aug 30, 2021  2217 HCG, Beta Chain, [place] Longest(!): 602 [JI]    Clinical Course User Index [JI] Victoriano Lain   MDM Rules/Calculators/A&P                           Patient with low pelvic pain with radiation into her back, new pregnancy, although her hCG quant is only 602, her LMP was approximately 2 months ago.  It is unclear at this time of this pregnancy dating, at 64 she should be 3 weeks, however it is possible this quant is a descending number.  She will need to ultrasound to further define the location of this pregnancy.  Ultrasound has been ordered.  Patient's case discussed with Dr. Debroah Loop with OB/GYN, he would like patient transferred down to women's MAU.  Discussed findings with patient who is agreeable with this plan.  She is stable at this time, she will be kept n.p.o. Final Clinical Impression(s) / ED Diagnoses Final diagnoses:  Pelvic pain in female  Less than [redacted] weeks gestation of pregnancy  Ectopic pregnancy without intrauterine pregnancy, unspecified location    Rx / DC Orders ED Discharge Orders     None        Victoriano Lain 08/30/21 2351    Bethann Berkshire,  MD 09/01/21 1053

## 2021-08-30 NOTE — ED Triage Notes (Signed)
Pt arrived via POV c/o abdominal pain, back pain, nausea without emesis and reports testing positive on a At-Home Pregnancy Test Today!

## 2021-08-31 ENCOUNTER — Inpatient Hospital Stay (HOSPITAL_COMMUNITY): Payer: BLUE CROSS/BLUE SHIELD | Admitting: Anesthesiology

## 2021-08-31 ENCOUNTER — Encounter (HOSPITAL_COMMUNITY): Payer: Self-pay | Admitting: Obstetrics & Gynecology

## 2021-08-31 ENCOUNTER — Encounter (HOSPITAL_COMMUNITY)
Admission: EM | Disposition: A | Discharge status: Home / Self Care | Payer: Self-pay | Source: Home / Self Care | Attending: Emergency Medicine

## 2021-08-31 DIAGNOSIS — Z833 Family history of diabetes mellitus: Secondary | ICD-10-CM | POA: Diagnosis not present

## 2021-08-31 DIAGNOSIS — Z801 Family history of malignant neoplasm of trachea, bronchus and lung: Secondary | ICD-10-CM | POA: Diagnosis not present

## 2021-08-31 DIAGNOSIS — Z20822 Contact with and (suspected) exposure to covid-19: Secondary | ICD-10-CM | POA: Diagnosis not present

## 2021-08-31 DIAGNOSIS — Z8261 Family history of arthritis: Secondary | ICD-10-CM | POA: Diagnosis not present

## 2021-08-31 DIAGNOSIS — Z793 Long term (current) use of hormonal contraceptives: Secondary | ICD-10-CM | POA: Diagnosis not present

## 2021-08-31 DIAGNOSIS — O00101 Right tubal pregnancy without intrauterine pregnancy: Secondary | ICD-10-CM | POA: Diagnosis not present

## 2021-08-31 DIAGNOSIS — Z3A01 Less than 8 weeks gestation of pregnancy: Secondary | ICD-10-CM | POA: Diagnosis not present

## 2021-08-31 DIAGNOSIS — Z823 Family history of stroke: Secondary | ICD-10-CM | POA: Diagnosis not present

## 2021-08-31 DIAGNOSIS — K661 Hemoperitoneum: Secondary | ICD-10-CM | POA: Diagnosis not present

## 2021-08-31 DIAGNOSIS — O009 Unspecified ectopic pregnancy without intrauterine pregnancy: Secondary | ICD-10-CM

## 2021-08-31 DIAGNOSIS — Z8249 Family history of ischemic heart disease and other diseases of the circulatory system: Secondary | ICD-10-CM | POA: Diagnosis not present

## 2021-08-31 DIAGNOSIS — R102 Pelvic and perineal pain: Secondary | ICD-10-CM | POA: Diagnosis not present

## 2021-08-31 HISTORY — PX: LAPAROSCOPIC UNILATERAL SALPINGECTOMY: SHX5934

## 2021-08-31 LAB — RESP PANEL BY RT-PCR (FLU A&B, COVID) ARPGX2
Influenza A by PCR: NEGATIVE
Influenza B by PCR: NEGATIVE
SARS Coronavirus 2 by RT PCR: NEGATIVE

## 2021-08-31 LAB — TYPE AND SCREEN
ABO/RH(D): O POS
Antibody Screen: NEGATIVE

## 2021-08-31 SURGERY — SALPINGECTOMY, UNILATERAL, LAPAROSCOPIC
Anesthesia: General | Laterality: Right

## 2021-08-31 MED ORDER — BUPIVACAINE HCL (PF) 0.25 % IJ SOLN
INTRAMUSCULAR | Status: AC
  Filled 2021-08-31: qty 30

## 2021-08-31 MED ORDER — OXYCODONE HCL 5 MG PO TABS: 5.0000 mg | ORAL_TABLET | Freq: Once | ORAL | Status: DC | PRN

## 2021-08-31 MED ORDER — OXYCODONE-ACETAMINOPHEN 5-325 MG PO TABS: 1.0000 | ORAL_TABLET | Freq: Four times a day (QID) | ORAL | 0 refills | Status: DC | PRN

## 2021-08-31 MED ORDER — PROPOFOL 10 MG/ML IV BOLUS
INTRAVENOUS | Status: AC
  Filled 2021-08-31: qty 20

## 2021-08-31 MED ORDER — MIDAZOLAM HCL 5 MG/5ML IJ SOLN
INTRAMUSCULAR | Status: DC | PRN
  Administered 2021-08-31: 2 mg via INTRAVENOUS

## 2021-08-31 MED ORDER — SODIUM CHLORIDE (PF) 0.9 % IJ SOLN
INTRAMUSCULAR | Status: AC
  Filled 2021-08-31: qty 10

## 2021-08-31 MED ORDER — PHENYLEPHRINE HCL-NACL 20-0.9 MG/250ML-% IV SOLN
INTRAVENOUS | Status: DC | PRN
  Administered 2021-08-31: 25 ug/min via INTRAVENOUS

## 2021-08-31 MED ORDER — PROPOFOL 10 MG/ML IV BOLUS
INTRAVENOUS | Status: DC | PRN
  Administered 2021-08-31: 200 mg via INTRAVENOUS

## 2021-08-31 MED ORDER — FENTANYL CITRATE (PF) 100 MCG/2ML IJ SOLN
INTRAMUSCULAR | Status: DC | PRN
  Administered 2021-08-31: 50 ug via INTRAVENOUS
  Administered 2021-08-31: 100 ug via INTRAVENOUS
  Administered 2021-08-31: 50 ug via INTRAVENOUS

## 2021-08-31 MED ORDER — SUCCINYLCHOLINE CHLORIDE 200 MG/10ML IV SOSY
PREFILLED_SYRINGE | INTRAVENOUS | Status: DC | PRN
  Administered 2021-08-31: 120 mg via INTRAVENOUS

## 2021-08-31 MED ORDER — LACTATED RINGERS IV SOLN: INTRAVENOUS | Status: DC | PRN

## 2021-08-31 MED ORDER — FENTANYL CITRATE (PF) 100 MCG/2ML IJ SOLN: 25.0000 ug | INTRAMUSCULAR | Status: DC | PRN

## 2021-08-31 MED ORDER — ONDANSETRON HCL 4 MG/2ML IJ SOLN
4.0000 mg | Freq: Four times a day (QID) | INTRAMUSCULAR | Status: DC | PRN
Start: 2021-08-31 — End: 2021-08-31

## 2021-08-31 MED ORDER — SUGAMMADEX SODIUM 200 MG/2ML IV SOLN
INTRAVENOUS | Status: DC | PRN
  Administered 2021-08-31: 200 mg via INTRAVENOUS

## 2021-08-31 MED ORDER — MIDAZOLAM HCL 2 MG/2ML IJ SOLN
INTRAMUSCULAR | Status: AC
  Filled 2021-08-31: qty 2

## 2021-08-31 MED ORDER — LIDOCAINE 2% (20 MG/ML) 5 ML SYRINGE
INTRAMUSCULAR | Status: DC | PRN
  Administered 2021-08-31: 60 mg via INTRAVENOUS

## 2021-08-31 MED ORDER — ONDANSETRON HCL 4 MG/2ML IJ SOLN
INTRAMUSCULAR | Status: DC | PRN
  Administered 2021-08-31: 4 mg via INTRAVENOUS

## 2021-08-31 MED ORDER — FENTANYL CITRATE (PF) 250 MCG/5ML IJ SOLN
INTRAMUSCULAR | Status: AC
  Filled 2021-08-31: qty 5

## 2021-08-31 MED ORDER — DEXAMETHASONE SODIUM PHOSPHATE 10 MG/ML IJ SOLN
INTRAMUSCULAR | Status: DC | PRN
  Administered 2021-08-31: 10 mg via INTRAVENOUS

## 2021-08-31 MED ORDER — OXYCODONE HCL 5 MG/5ML PO SOLN: 5.0000 mg | Freq: Once | ORAL | Status: DC | PRN

## 2021-08-31 MED ORDER — SODIUM CHLORIDE 0.9 % IR SOLN
Status: DC | PRN
  Administered 2021-08-31: 3000 mL

## 2021-08-31 MED ORDER — ROCURONIUM BROMIDE 10 MG/ML (PF) SYRINGE
PREFILLED_SYRINGE | INTRAVENOUS | Status: DC | PRN
  Administered 2021-08-31: 40 mg via INTRAVENOUS

## 2021-08-31 MED ORDER — BUPIVACAINE HCL (PF) 0.25 % IJ SOLN
INTRAMUSCULAR | Status: DC | PRN
  Administered 2021-08-31: 7 mL

## 2021-08-31 MED ORDER — IBUPROFEN 600 MG PO TABS: 600.0000 mg | ORAL_TABLET | Freq: Four times a day (QID) | ORAL | 3 refills | Status: DC | PRN

## 2021-08-31 SURGICAL SUPPLY — 28 items
DERMABOND ADVANCED (GAUZE/BANDAGES/DRESSINGS) ×1
DERMABOND ADVANCED .7 DNX12 (GAUZE/BANDAGES/DRESSINGS) ×2 IMPLANT
DURAPREP 26ML APPLICATOR (WOUND CARE) ×3 IMPLANT
GLOVE SURG ENC MOIS LTX SZ6.5 (GLOVE) ×3 IMPLANT
GLOVE SURG UNDER POLY LF SZ7 (GLOVE) ×12 IMPLANT
GOWN STRL REUS W/ TWL LRG LVL3 (GOWN DISPOSABLE) ×4 IMPLANT
GOWN STRL REUS W/TWL LRG LVL3 (GOWN DISPOSABLE) ×6
IRRIGATION STRYKERFLOW (MISCELLANEOUS) ×2 IMPLANT
IRRIGATOR STRYKERFLOW (MISCELLANEOUS) ×3
KIT TURNOVER KIT B (KITS) ×3 IMPLANT
NEEDLE INSUFFLATION 14GA 120MM (NEEDLE) ×3 IMPLANT
NS IRRIG 1000ML POUR BTL (IV SOLUTION) ×3 IMPLANT
PACK LAPAROSCOPY BASIN (CUSTOM PROCEDURE TRAY) ×3 IMPLANT
PACK TRENDGUARD 450 HYBRID PRO (MISCELLANEOUS) ×2 IMPLANT
POUCH SPECIMEN RETRIEVAL 10MM (ENDOMECHANICALS) ×3 IMPLANT
PROTECTOR NERVE ULNAR (MISCELLANEOUS) ×6 IMPLANT
SET TUBE SMOKE EVAC HIGH FLOW (TUBING) ×3 IMPLANT
SHEARS HARMONIC ACE PLUS 36CM (ENDOMECHANICALS) ×3 IMPLANT
SLEEVE ENDOPATH XCEL 5M (ENDOMECHANICALS) ×3 IMPLANT
SUT VICRYL 0 UR6 27IN ABS (SUTURE) ×3 IMPLANT
SUT VICRYL 4-0 PS2 18IN ABS (SUTURE) ×3 IMPLANT
TOWEL GREEN STERILE FF (TOWEL DISPOSABLE) ×6 IMPLANT
TRAY FOLEY W/BAG SLVR 14FR (SET/KITS/TRAYS/PACK) ×3 IMPLANT
TRENDGUARD 450 HYBRID PRO PACK (MISCELLANEOUS) ×3
TROCAR XCEL DIL TIP R 11M (ENDOMECHANICALS) ×3 IMPLANT
TROCAR XCEL NON-BLD 11X100MML (ENDOMECHANICALS) ×3 IMPLANT
TROCAR XCEL NON-BLD 5MMX100MML (ENDOMECHANICALS) ×3 IMPLANT
WARMER LAPAROSCOPE (MISCELLANEOUS) ×3 IMPLANT

## 2021-08-31 NOTE — Anesthesia Procedure Notes (Signed)
Procedure Name: Intubation Date/Time: 08/31/2021 3:28 AM Performed by: Edmonia Caprio, CRNA Pre-anesthesia Checklist: Patient identified, Emergency Drugs available, Suction available, Patient being monitored and Timeout performed Patient Re-evaluated:Patient Re-evaluated prior to induction Oxygen Delivery Method: Circle system utilized Preoxygenation: Pre-oxygenation with 100% oxygen Induction Type: IV induction and Rapid sequence Laryngoscope Size: Miller and 2 Grade View: Grade I Tube type: Oral Tube size: 7.0 mm Number of attempts: 1 Airway Equipment and Method: Stylet Secured at: 22 cm Tube secured with: Tape Dental Injury: Teeth and Oropharynx as per pre-operative assessment

## 2021-08-31 NOTE — H&P (Signed)
Cc: abdominal pain   Amanda Mcmahon is an 25 y.o. female. G2P1001 No LMP recorded. Patient is pregnant. Amanda Mcmahon is a 25 y.o. female who was currently G2, P1 who just found today that she is pregnant by home pregnancy test presenting with complaints of low pelvic cramping pain which radiates into her back, nausea without emesis.  She was on Depo injections which were discontinued in April as she was discovered to have osteopenia.  She has noticed some breast tenderness, along with some abdominal distention and therefore took a home pregnancy test when she developed pain today and was surprised to discover she was positive.  She denies vaginal discharge or vaginal bleeding, doubts risk factors for STDs.  Her LMP was approximately 2 months ago, but states she has been historically very regular with her periods since coming off of the Depo Provera.  No other significant past medical history, no surgical history. She was evaluated at [facility] and diagnosed with ruptured ectopic pregnancy  Pertinent Gynecological History: Menses: flow is light Contraception: no Blood transfusions: none Sexually transmitted diseases: no past history Last pap: normal Date: 12/2020   Menstrual History:  No LMP recorded. Patient is pregnant.    Past Medical History:  Diagnosis Date   Medical history non-contributory    Osteopenia 02/09/2021   Discontinue provera, recheck bone density in 2024    Past Surgical History:  Procedure Laterality Date   NO PAST SURGERIES      Family History  Problem Relation Age of Onset   Arthritis Mother    Asthma Maternal Grandmother    Cancer Maternal Grandmother        lung   COPD Maternal Grandmother    Depression Maternal Grandmother    Diabetes Maternal Grandmother    Hypertension Maternal Grandmother    Stroke Maternal Grandmother    Hypertension Maternal Grandfather    Diabetes Paternal Grandmother     Social History:  reports that she has never smoked.  She has never used smokeless tobacco. She reports current alcohol use. She reports that she does not currently use drugs after having used the following drugs: Marijuana.  Allergies: No Known Allergies  Medications Prior to Admission  Medication Sig Dispense Refill Last Dose   acetaminophen (TYLENOL) 160 MG/5ML elixir Take 500 mg by mouth once.   08/30/2021 at 1000   medroxyPROGESTERone (DEPO-PROVERA) 150 MG/ML injection Inject 1 mL (150 mg total) into the muscle every 3 (three) months. 1 mL 3     Review of Systems  Blood pressure 125/75, pulse 93, temperature 98.1 F (36.7 C), temperature source Oral, resp. rate 18, height 5\' 3"  (1.6 m), weight 86.2 kg, SpO2 100 %. Physical Exam Vitals and nursing note reviewed.  Constitutional:      Appearance: She is well-developed.  HENT:     Head: Normocephalic.  Cardiovascular:     Rate and Rhythm: Normal rate.  Pulmonary:     Effort: Pulmonary effort is normal.  Abdominal:     General: Abdomen is flat.     Palpations: Abdomen is soft.     Tenderness: There is abdominal tenderness in the suprapubic area.  Skin:    General: Skin is warm and dry.  Neurological:     Mental Status: She is alert.  Psychiatric:        Mood and Affect: Mood normal.        Behavior: Behavior normal.    Results for orders placed or performed during the hospital encounter  of 08/30/21 (from the past 24 hour(s))  Urinalysis, Routine w reflex microscopic Urine, Clean Catch     Status: Abnormal   Collection Time: 08/30/21  8:30 PM  Result Value Ref Range   Color, Urine YELLOW YELLOW   APPearance CLEAR CLEAR   Specific Gravity, Urine 1.020 1.005 - 1.030   pH 6.0 5.0 - 8.0   Glucose, UA NEGATIVE NEGATIVE mg/dL   Hgb urine dipstick NEGATIVE NEGATIVE   Bilirubin Urine NEGATIVE NEGATIVE   Ketones, ur 20 (A) NEGATIVE mg/dL   Protein, ur NEGATIVE NEGATIVE mg/dL   Nitrite NEGATIVE NEGATIVE   Leukocytes,Ua NEGATIVE NEGATIVE  Pregnancy, urine     Status: Abnormal    Collection Time: 08/30/21  8:30 PM  Result Value Ref Range   Preg Test, Ur POSITIVE (A) NEGATIVE  CBC with Differential/Platelet     Status: Abnormal   Collection Time: 08/30/21  8:52 PM  Result Value Ref Range   WBC 13.9 (H) 4.0 - 10.5 K/uL   RBC 4.32 3.87 - 5.11 MIL/uL   Hemoglobin 14.1 12.0 - 15.0 g/dL   HCT 10.2 58.5 - 27.7 %   MCV 93.3 80.0 - 100.0 fL   MCH 32.6 26.0 - 34.0 pg   MCHC 35.0 30.0 - 36.0 g/dL   RDW 82.4 23.5 - 36.1 %   Platelets 303 150 - 400 K/uL   nRBC 0.0 0.0 - 0.2 %   Neutrophils Relative % 83 %   Neutro Abs 11.6 (H) 1.7 - 7.7 K/uL   Lymphocytes Relative 9 %   Lymphs Abs 1.2 0.7 - 4.0 K/uL   Monocytes Relative 7 %   Monocytes Absolute 0.9 0.1 - 1.0 K/uL   Eosinophils Relative 0 %   Eosinophils Absolute 0.0 0.0 - 0.5 K/uL   Basophils Relative 0 %   Basophils Absolute 0.0 0.0 - 0.1 K/uL   Immature Granulocytes 1 %   Abs Immature Granulocytes 0.10 (H) 0.00 - 0.07 K/uL  Comprehensive metabolic panel     Status: Abnormal   Collection Time: 08/30/21  8:52 PM  Result Value Ref Range   Sodium 134 (L) 135 - 145 mmol/L   Potassium 3.8 3.5 - 5.1 mmol/L   Chloride 106 98 - 111 mmol/L   CO2 21 (L) 22 - 32 mmol/L   Glucose, Bld 109 (H) 70 - 99 mg/dL   BUN 5 (L) 6 - 20 mg/dL   Creatinine, Ser 4.43 0.44 - 1.00 mg/dL   Calcium 8.9 8.9 - 15.4 mg/dL   Total Protein 7.0 6.5 - 8.1 g/dL   Albumin 3.8 3.5 - 5.0 g/dL   AST 19 15 - 41 U/L   ALT 25 0 - 44 U/L   Alkaline Phosphatase 94 38 - 126 U/L   Total Bilirubin 1.4 (H) 0.3 - 1.2 mg/dL   GFR, Estimated >00 >86 mL/min   Anion gap 7 5 - 15  hCG, quantitative, pregnancy     Status: Abnormal   Collection Time: 08/30/21  8:52 PM  Result Value Ref Range   hCG, Beta Chain, Quant, S 602 (H) <5 mIU/mL  Wet prep, genital     Status: Abnormal   Collection Time: 08/30/21 10:50 PM   Specimen: PATH Cytology Cervicovaginal Ancillary Only; Genital  Result Value Ref Range   Yeast Wet Prep HPF POC NONE SEEN NONE SEEN   Trich, Wet  Prep NONE SEEN NONE SEEN   Clue Cells Wet Prep HPF POC NONE SEEN NONE SEEN   WBC, Wet Prep HPF  POC <10 (A) NONE SEEN   Sperm NONE SEEN   Resp Panel by RT-PCR (Flu A&B, Covid) Nasopharyngeal Swab     Status: None   Collection Time: 08/31/21 12:20 AM   Specimen: Nasopharyngeal Swab; Nasopharyngeal(NP) swabs in vial transport medium  Result Value Ref Range   SARS Coronavirus 2 by RT PCR NEGATIVE NEGATIVE   Influenza A by PCR NEGATIVE NEGATIVE   Influenza B by PCR NEGATIVE NEGATIVE    US OB Comp < 14 Wks  Result Date: 08/30/2021 CLINICAL DATA:  Pregnant patient with pelvic pain.  Beta HCG 602 EXAM: OBSTETRIC <14 WK [place] AND TRANSVAGINAL OB US TECHNIQUE: Both transabdominal and transvaginal ultrasound examinations were performed for complete evaluation of the gestation as well as the maternal uterus, adnexal regions, and pelvic cul-de-sac. Transvaginal technique was performed to assess early pregnancy. COMPARISON:  None this pregnancy FINDINGS: Intrauterine gestational sac: Complex fluid and echogenic debris in the endometrial canal but no gestational sac. Yolk sac:  Not Visualized. Embryo:  Not Visualized. Cardiac Activity: Not Visualized. Maternal uterus/adnexae: There is complex fluid and echogenic debris within the endometrial canal but no gestational sac. Large amount of complex free fluid consistent with blood products in the pelvis. There is echogenic material adjacent to the right ovary without well-defined adnexal mass. Cystic structure in the right ovary with peripheral flow measuring 1.6 cm is favored to represent a corpus luteal or hemorrhagic cyst. The left ovary appears normal. Ovarian blood flow is seen. IMPRESSION: 1. Large amount of blood in the pelvis, with echogenic material adjacent to the right ovary. While there is no definitive adnexal mass, findings are highly suspicious for ruptured ectopic pregnancy on the right. 2. Right ovarian cyst with peripheral blood flow measuring 1.6 cm is  favored to represent a corpus luteal or hemorrhagic cyst rather than an ovarian ectopic. Ruptured hemorrhagic cyst as cause of pelvic hemorrhage is not entirely excluded, however in this setting ruptured ectopic is favored. 3. Complex fluid and echogenic debris in the endometrial canal without intrauterine gestational sac. Critical Value/emergent results were called by telephone at the time of interpretation on 08/30/2021 at 11:39 pm to provider JULIE IDOL , who verbally acknowledged these results. Electronically Signed   By: Narda Rutherford M.D.   On: 08/30/2021 23:41   US OB Transvaginal  Result Date: 08/30/2021 CLINICAL DATA:  Pregnant patient with pelvic pain.  Beta HCG 602 EXAM: OBSTETRIC <14 WK [place] AND TRANSVAGINAL OB US TECHNIQUE: Both transabdominal and transvaginal ultrasound examinations were performed for complete evaluation of the gestation as well as the maternal uterus, adnexal regions, and pelvic cul-de-sac. Transvaginal technique was performed to assess early pregnancy. COMPARISON:  None this pregnancy FINDINGS: Intrauterine gestational sac: Complex fluid and echogenic debris in the endometrial canal but no gestational sac. Yolk sac:  Not Visualized. Embryo:  Not Visualized. Cardiac Activity: Not Visualized. Maternal uterus/adnexae: There is complex fluid and echogenic debris within the endometrial canal but no gestational sac. Large amount of complex free fluid consistent with blood products in the pelvis. There is echogenic material adjacent to the right ovary without well-defined adnexal mass. Cystic structure in the right ovary with peripheral flow measuring 1.6 cm is favored to represent a corpus luteal or hemorrhagic cyst. The left ovary appears normal. Ovarian blood flow is seen. IMPRESSION: 1. Large amount of blood in the pelvis, with echogenic material adjacent to the right ovary. While there is no definitive adnexal mass, findings are highly suspicious for ruptured ectopic pregnancy on  the  right. 2. Right ovarian cyst with peripheral blood flow measuring 1.6 cm is favored to represent a corpus luteal or hemorrhagic cyst rather than an ovarian ectopic. Ruptured hemorrhagic cyst as cause of pelvic hemorrhage is not entirely excluded, however in this setting ruptured ectopic is favored. 3. Complex fluid and echogenic debris in the endometrial canal without intrauterine gestational sac. Critical Value/emergent results were called by telephone at the time of interpretation on 08/30/2021 at 11:39 pm to provider JULIE IDOL , who verbally acknowledged these results. Electronically Signed   By: Narda Rutherford M.D.   On: 08/30/2021 23:41    Assessment/Plan: Suspected ruptured ectopic pregnancy. Needs to be managed with laparoscopy, removal with possible salpingectomy. Patient desires surgical management  The risks of surgery were discussed in detail with the patient including but not limited to: bleeding which may require transfusion or reoperation; infection which may require prolonged hospitalization or re-hospitalization and antibiotic therapy; injury to bowel, bladder, ureters and major vessels or other surrounding organs; formation of adhesions; need for additional procedures including laparotomy or subsequent procedures secondary to abnormal pathology; thromboembolic phenomenon; incisional problems and other postoperative or anesthesia complications.  Patient was told that the likelihood that her condition and symptoms will be treated effectively with this surgical management was very high; the postoperative expectations were also discussed in detail. The patient also understands the alternative treatment options which were discussed in full. All questions were answered.     Scheryl Darter 08/31/2021, 2:11 AM

## 2021-08-31 NOTE — ED Notes (Signed)
Pt ambulatory to restroom, tolerated fair.  Pt has small amount of vaginal bleeding at this time.

## 2021-08-31 NOTE — Transfer of Care (Signed)
Immediate Anesthesia Transfer of Care Note  Patient: Amanda Mcmahon  Procedure(s) Performed: LAPAROSCOPIC RIGHT SALPINGECTOMY WITH REMOVAL OF ECTOPIC PREGNANCY (Right)  Patient Location: PACU  Anesthesia Type:General  Level of Consciousness: awake and alert   Airway & Oxygen Therapy: Patient Spontanous Breathing  Post-op Assessment: Report given to RN and Post -op Vital signs reviewed and stable  Post vital signs: Reviewed and stable  Last Vitals:  Vitals Value Taken Time  BP    Temp    Pulse 92 08/31/21 0450  Resp 20 08/31/21 0450  SpO2 100 % 08/31/21 0450  Vitals shown include unvalidated device data.  Last Pain:  Vitals:   08/31/21 0133  TempSrc:   PainSc: 7          Complications: No notable events documented.

## 2021-08-31 NOTE — MAU Note (Signed)
Amanda Mcmahon is a 25 y.o. received via [facility] transport from [facility] with reported ruptured ectopic pregnancy. LMP: unknown approx. 2 months ago Onset of complaint: 0900 08/30/2021 Pain score: 7 L abdomen.  20g NS loc in L antecub. Pt alert and oriented BP 125/75 HR 93 R 18 T 98.1     :

## 2021-08-31 NOTE — Op Note (Signed)
Amanda Mcmahon PROCEDURE DATE: 08/31/2021  PREOPERATIVE DIAGNOSIS: Ruptured ectopic pregnancy POSTOPERATIVE DIAGNOSIS: Ruptured right fallopian tube ectopic pregnancy PROCEDURE: Laparoscopic right salpingectomy and removal of ectopic pregnancy SURGEON:  Adam Phenix, MD ANESTHESIOLOGIST: Achille Rich, MD Anesthesiologist: Achille Rich, MD CRNA: Edmonia Caprio, CRNA  INDICATIONS: 25 y.o. G2P1001 at Unknown here with the preoperative diagnoses as listed above.  Please refer to preoperative notes for more details. Patient was counseled regarding need for laparoscopic salpingectomy. Risks of surgery including bleeding which may require transfusion or reoperation, infection, injury to bowel or other surrounding organs, need for additional procedures including laparotomy and other postoperative/anesthesia complications were explained to patient.  Written informed consent was obtained.  FINDINGS:  moderate amount of hemoperitoneum estimated to be about 100 ml  of blood and clots.  Dilated right fallopian tube containing ectopic gestation. Small normal appearing uterus, normal left fallopian tube, left ovary and right ovary.  ANESTHESIA: General INTRAVENOUS FLUIDS: 500 ml ESTIMATED BLOOD LOSS: 100 ml URINE OUTPUT: 50 ml SPECIMENS: Right fallopian tube containing ectopic gestation COMPLICATIONS: None immediate  PROCEDURE IN DETAIL:  The patient was taken to the operating room where general anesthesia was administered and was found to be adequate.  She was placed in the dorsal lithotomy position, and was prepped and draped in a sterile manner.  A Foley catheter was inserted into her bladder and attached to constant drainage and a uterine manipulator was then advanced into the uterus .    After an adequate timeout was performed, attention was turned to the abdomen where an umbilical incision was made with the scalpel.  The Optiview 11-mm trocar and sleeve were then advanced without difficulty  with the laparoscope under direct visualization into the abdomen.  The abdomen was then insufflated with carbon dioxide gas and adequate pneumoperitoneum was obtained.  A survey of the patient's pelvis and abdomen revealed the findings above.  Two 5-mm left lower quadrant ports were then placed under direct visualization.  The Nezhat suction irrigator was then used to suction the hemoperitoneum and irrigate the pelvis.  Attention was then turned to the right fallopian tube which was grasped and ligated from the underlying mesosalpinx and uterine attachment using the Harmonic instrument.  Good hemostasis was noted.  The specimen was placed in an EndoCatch bag and removed from the abdomen intact.  The abdomen was desufflated, and all instruments were removed.  The fascial incision of the 10-mm site was reapproximated with a 0 Vicryl  stitch; and all skin incisions were closed with 4-0 Vicryl and Dermabond. The patient tolerated the procedure well.  All instruments, needles, and sponge counts were correct x 2. The patient was taken to the recovery room in stable condition.   The patient will be discharged to home as per PACU criteria.  Routine postoperative instructions given.  She was prescribed Percocet, Ibuprofen.  She will follow up in the clinic in about 2-3 weeks for postoperative evaluation.   Adam Phenix, MD 08/31/2021 4:44 AM

## 2021-08-31 NOTE — ED Notes (Signed)
Pt to [facility] MAU via [facility]. Pt alert, oriented, NAD noted on departure.

## 2021-08-31 NOTE — MAU Note (Signed)
Report called to Haig Prophet CRNA-OB RN charge called -pt ready to be transferred to Short Stay

## 2021-08-31 NOTE — Anesthesia Preprocedure Evaluation (Addendum)
Anesthesia Evaluation  Patient identified by MRN, date of birth, ID band Patient awake    Reviewed: Allergy & Precautions, H&P , NPO status , Patient's Chart, lab work & pertinent test results  Airway Mallampati: II   Neck ROM: full    Dental  (+) Teeth Intact, Dental Advisory Given   Pulmonary neg pulmonary ROS,    breath sounds clear to auscultation       Cardiovascular negative cardio ROS   Rhythm:regular Rate:Normal     Neuro/Psych    GI/Hepatic   Endo/Other  obese  Renal/GU      Musculoskeletal   Abdominal   Peds  Hematology   Anesthesia Other Findings   Reproductive/Obstetrics Ruptured ectopic pregnancy                            Anesthesia Physical Anesthesia Plan  ASA: 2 and emergent  Anesthesia Plan: General   Post-op Pain Management:    Induction: Intravenous  PONV Risk Score and Plan: 3 and Ondansetron, Dexamethasone, Midazolam and Treatment may vary due to age or medical condition  Airway Management Planned: Oral ETT  Additional Equipment:   Intra-op Plan:   Post-operative Plan: Extubation in OR  Informed Consent: I have reviewed the patients History and Physical, chart, labs and discussed the procedure including the risks, benefits and alternatives for the proposed anesthesia with the patient or authorized representative who has indicated his/her understanding and acceptance.     Dental advisory given  Plan Discussed with: CRNA, Anesthesiologist and Surgeon  Anesthesia Plan Comments:         Anesthesia Quick Evaluation

## 2021-09-01 ENCOUNTER — Telehealth: Payer: Self-pay | Admitting: Obstetrics & Gynecology

## 2021-09-01 ENCOUNTER — Encounter (HOSPITAL_COMMUNITY): Payer: Self-pay | Admitting: Obstetrics & Gynecology

## 2021-09-01 LAB — GC/CHLAMYDIA PROBE AMP (~~LOC~~) NOT AT ARMC
Chlamydia: NEGATIVE
Comment: NEGATIVE
Comment: NORMAL
Neisseria Gonorrhea: NEGATIVE

## 2021-09-01 NOTE — Anesthesia Postprocedure Evaluation (Signed)
Anesthesia Post Note  Patient: SARIYA TRICKEY  Procedure(s) Performed: LAPAROSCOPIC RIGHT SALPINGECTOMY WITH REMOVAL OF ECTOPIC PREGNANCY (Right)     Patient location during evaluation: PACU Anesthesia Type: General Level of consciousness: awake and alert Pain management: pain level controlled Vital Signs Assessment: post-procedure vital signs reviewed and stable Respiratory status: spontaneous breathing, nonlabored ventilation, respiratory function stable and patient connected to nasal cannula oxygen Cardiovascular status: blood pressure returned to baseline and stable Postop Assessment: no apparent nausea or vomiting Anesthetic complications: no   No notable events documented.  Last Vitals:  Vitals:   08/31/21 0520 08/31/21 0535  BP: 117/81 113/77  Pulse: 80 87  Resp: 19 19  Temp:    SpO2: 100% 100%    Last Pain:  Vitals:   08/31/21 0515  TempSrc:   PainSc: 0-No pain                 Daquisha Clermont S

## 2021-09-01 NOTE — Telephone Encounter (Signed)
Pt had emergency surgery 10/29 by Dr. Debroah Loop Pt requesting work note.  States the hospital gave her a work excuse for today but wouldn't extend it  Please review hospital notes & advise on extending work excuse  Pt has f/up appt here 11/21 w/Dr. Despina Hidden

## 2021-09-01 NOTE — Telephone Encounter (Signed)
Patient states she received note for today but would like to be extended until next Monday as she is still in some pain.  Had surgery yesterday for removal of ectopic pregnancy.  Will extend until next Monday.

## 2021-09-02 LAB — SURGICAL PATHOLOGY

## 2021-09-03 ENCOUNTER — Telehealth: Payer: Self-pay | Admitting: *Deleted

## 2021-09-03 NOTE — Telephone Encounter (Signed)
Pt called with cramps and bleeding. Cramps started yesterday and bleeding started today. Pt had surgery for ectopic pregnancy on 10/29. Pt has taken Tylenol and Percocet. Has been taking Percocet just at bedtime.  Hasn't tried Ibuprofen yet. I advised cramping and bleeding sounds normal after this surgery but I would route note to Dr. Despina Hidden for review. I advised she probably needs to take Percocet more than just at bedtime. JSY

## 2021-09-04 ENCOUNTER — Encounter (HOSPITAL_COMMUNITY): Payer: Self-pay | Admitting: Emergency Medicine

## 2021-09-04 ENCOUNTER — Emergency Department (HOSPITAL_COMMUNITY)
Admission: EM | Admit: 2021-09-04 | Discharge: 2021-09-04 | Disposition: A | Discharge status: Home / Self Care | Payer: BLUE CROSS/BLUE SHIELD | Attending: Emergency Medicine | Admitting: Emergency Medicine

## 2021-09-04 DIAGNOSIS — G8918 Other acute postprocedural pain: Secondary | ICD-10-CM | POA: Insufficient documentation

## 2021-09-04 DIAGNOSIS — R102 Pelvic and perineal pain: Secondary | ICD-10-CM | POA: Diagnosis not present

## 2021-09-04 LAB — CBC WITH DIFFERENTIAL/PLATELET
Abs Immature Granulocytes: 0.1 10*3/uL — ABNORMAL HIGH (ref 0.00–0.07)
Basophils Absolute: 0 10*3/uL (ref 0.0–0.1)
Basophils Relative: 1 %
Eosinophils Absolute: 0 10*3/uL (ref 0.0–0.5)
Eosinophils Relative: 1 %
HCT: 40.1 % (ref 36.0–46.0)
Hemoglobin: 14.4 g/dL (ref 12.0–15.0)
Immature Granulocytes: 1 %
Lymphocytes Relative: 19 %
Lymphs Abs: 1.5 10*3/uL (ref 0.7–4.0)
MCH: 33.2 pg (ref 26.0–34.0)
MCHC: 35.9 g/dL (ref 30.0–36.0)
MCV: 92.4 fL (ref 80.0–100.0)
Monocytes Absolute: 0.7 10*3/uL (ref 0.1–1.0)
Monocytes Relative: 8 %
Neutro Abs: 5.7 10*3/uL (ref 1.7–7.7)
Neutrophils Relative %: 70 %
Platelets: 327 10*3/uL (ref 150–400)
RBC: 4.34 MIL/uL (ref 3.87–5.11)
RDW: 12 % (ref 11.5–15.5)
WBC: 8.1 10*3/uL (ref 4.0–10.5)
nRBC: 0 % (ref 0.0–0.2)

## 2021-09-04 LAB — COMPREHENSIVE METABOLIC PANEL
ALT: 27 U/L (ref 0–44)
AST: 21 U/L (ref 15–41)
Albumin: 3.6 g/dL (ref 3.5–5.0)
Alkaline Phosphatase: 90 U/L (ref 38–126)
Anion gap: 6 (ref 5–15)
BUN: 7 mg/dL (ref 6–20)
CO2: 25 mmol/L (ref 22–32)
Calcium: 9.1 mg/dL (ref 8.9–10.3)
Chloride: 105 mmol/L (ref 98–111)
Creatinine, Ser: 0.77 mg/dL (ref 0.44–1.00)
GFR, Estimated: >60 mL/min (ref >60)
Glucose, Bld: 93 mg/dL (ref 70–99)
Potassium: 4.1 mmol/L (ref 3.5–5.1)
Sodium: 136 mmol/L (ref 135–145)
Total Bilirubin: 1.2 mg/dL (ref 0.3–1.2)
Total Protein: 6.6 g/dL (ref 6.5–8.1)

## 2021-09-04 MED ORDER — ONDANSETRON HCL 4 MG/2ML IJ SOLN
4.0000 mg | Freq: Once | INTRAMUSCULAR | Status: AC
  Administered 2021-09-04: 4 mg via INTRAVENOUS
  Filled 2021-09-04: qty 2

## 2021-09-04 MED ORDER — HYDROCODONE-ACETAMINOPHEN 10-325 MG PO TABS: 1.0000 | ORAL_TABLET | Freq: Four times a day (QID) | ORAL | 0 refills | Status: DC | PRN

## 2021-09-04 MED ORDER — HYDROMORPHONE HCL 1 MG/ML IJ SOLN
1.0000 mg | Freq: Once | INTRAMUSCULAR | Status: AC
  Administered 2021-09-04: 1 mg via INTRAVENOUS
  Filled 2021-09-04: qty 1

## 2021-09-04 NOTE — Discharge Instructions (Addendum)
Follow up with Dr. Despina Hidden as scheduled

## 2021-09-04 NOTE — ED Triage Notes (Signed)
Pt to the ED with continued pelvic pain after emergency surgery for an ectopic pregnancy on Sunday at [facility] center.

## 2021-09-04 NOTE — Telephone Encounter (Signed)
Spoke with pt letting her know cramps and bleeding is a normal response to what she has had done per Dr. Despina Hidden. Pt voiced understanding. Pt did end up going to ER this am. JSY

## 2021-09-04 NOTE — ED Provider Notes (Signed)
[facility] EMERGENCY DEPARTMENT Provider Note   CSN: [id number] Arrival date & time: 09/04/21  4097     History Chief Complaint  Patient presents with   Pelvic Pain    Amanda Mcmahon is a 25 y.o. female.  The history is provided by the patient. No language interpreter was used.  Pelvic Pain This is a new problem. The problem occurs constantly. The problem has not changed since onset.Nothing aggravates the symptoms. Nothing relieves the symptoms. She has tried nothing for the symptoms.  Pt had a ruptured ectopic pregnancy and a salpingectomy on 10/30.  Pt complains of vaginal bleeding and continued pain.  No relief with  ppercocet     Past Medical History:  Diagnosis Date   Medical history non-contributory    Osteopenia 02/09/2021   Discontinue provera, recheck bone density in 2024    Patient Active Problem List   Diagnosis Date Noted   Ectopic pregnancy 08/31/2021   Osteopenia 02/09/2021   Encounter for health maintenance examination 07/17/2020   Encounter for surveillance of injectable contraceptive 07/17/2020    Past Surgical History:  Procedure Laterality Date   LAPAROSCOPIC UNILATERAL SALPINGECTOMY Right 08/31/2021   Procedure: LAPAROSCOPIC RIGHT SALPINGECTOMY WITH REMOVAL OF ECTOPIC PREGNANCY;  Surgeon: Adam Phenix, MD;  Location: [facility] OR;  Service: Gynecology;  Laterality: Right;   NO PAST SURGERIES       OB History     Gravida  2   Para  1   Term  1   Preterm      AB      Living  1      SAB      IAB      Ectopic      Multiple  0   Live Births  1           Family History  Problem Relation Age of Onset   Arthritis Mother    Asthma Maternal Grandmother    Cancer Maternal Grandmother        lung   COPD Maternal Grandmother    Depression Maternal Grandmother    Diabetes Maternal Grandmother    Hypertension Maternal Grandmother    Stroke Maternal Grandmother    Hypertension Maternal Grandfather    Diabetes Paternal Grandmother      Social History   Tobacco Use   Smoking status: Never   Smokeless tobacco: Never  Vaping Use   Vaping Use: Never used  Substance Use Topics   Alcohol use: Yes   Drug use: Not Currently    Types: Marijuana    Comment: socially    Home Medications Prior to Admission medications   Medication Sig Start Date End Date Taking? Authorizing Provider  HYDROcodone-acetaminophen (NORCO) 10-325 MG tablet Take 1 tablet by mouth every 6 (six) hours as needed. 09/04/21  Yes Elson Areas, PA-C  oxyCODONE-acetaminophen (PERCOCET/ROXICET) 5-325 MG tablet Take 1-2 tablets by mouth every 6 (six) hours as needed. Patient taking differently: Take 1-2 tablets by mouth every 6 (six) hours as needed for moderate pain. 08/31/21  Yes Adam Phenix, MD  ibuprofen (ADVIL) 600 MG tablet Take 1 tablet (600 mg total) by mouth every 6 (six) hours as needed. Patient not taking: Reported on 09/04/2021 08/31/21   Adam Phenix, MD  medroxyPROGESTERone (DEPO-PROVERA) 150 MG/ML injection Inject 1 mL (150 mg total) into the muscle every 3 (three) months. Patient not taking: Reported on 09/04/2021 01/23/21   Lazaro Arms, MD    Allergies  Patient has no known allergies.  Review of Systems   Review of Systems  Genitourinary:  Positive for pelvic pain.  All other systems reviewed and are negative.  Physical Exam Updated Vital Signs BP 110/70 (BP Location: Right Arm)   Pulse 78   Temp 98.8 F (37.1 C) (Oral)   Resp 17   Ht 5\' 3"  (1.6 m)   Wt 86.2 kg   LMP 12/30/2020   SpO2 98%   BMI 33.66 kg/m   Physical Exam Constitutional:      Appearance: Normal appearance.  Cardiovascular:     Rate and Rhythm: Normal rate.  Pulmonary:     Effort: Pulmonary effort is normal.  Abdominal:     General: Abdomen is flat.  Musculoskeletal:        General: Normal range of motion.  Skin:    General: Skin is warm.  Neurological:     General: No focal deficit present.     Mental Status: She is alert.   Psychiatric:        Mood and Affect: Mood normal.    ED Results / Procedures / Treatments   Labs (all labs ordered are listed, but only abnormal results are displayed) Labs Reviewed  CBC WITH DIFFERENTIAL/PLATELET - Abnormal; Notable for the following components:      Result Value   Abs Immature Granulocytes 0.10 (*)    All other components within normal limits  COMPREHENSIVE METABOLIC PANEL    EKG None  Radiology No results found.  Procedures Procedures   Medications Ordered in ED Medications  HYDROmorphone (DILAUDID) injection 1 mg (1 mg Intravenous Given 09/04/21 0940)  ondansetron (ZOFRAN) injection 4 mg (4 mg Intravenous Given 09/04/21 0940)    ED Course  I have reviewed the triage vital signs and the nursing notes.  Pertinent labs & imaging results that were available during my care of the patient were reviewed by me and considered in my medical decision making (see chart for details).    MDM Rules/Calculators/A&P                           MDM:  cnc and Cmet normal.  I discussed pt with Dr. 13/3/22. He advised bleeding cramping and pain are normal.  Pt given rx for Hydrocodne 10/325.  Pt advised to follow up as scheduled  Final Clinical Impression(s) / ED Diagnoses Final diagnoses:  Post-op pain    Rx / DC Orders ED Discharge Orders          Ordered    HYDROcodone-acetaminophen (NORCO) 10-325 MG tablet  Every 6 hours PRN        09/04/21 1211          An After Visit Summary was printed and given to the patient.    13/03/22, Elson Areas 09/04/21 1252    13/03/22, MD 09/11/21 13/10/22

## 2021-09-04 NOTE — Telephone Encounter (Signed)
It is normal and her bleeding is due to the drop in HCG, normal response

## 2021-09-05 ENCOUNTER — Telehealth: Payer: Self-pay

## 2021-09-05 ENCOUNTER — Ambulatory Visit: Payer: Medicaid Other | Admitting: Nurse Practitioner

## 2021-09-05 NOTE — Telephone Encounter (Signed)
Transition Care Management Follow-up Telephone Call Date of discharge and from where: 09/04/2021-[place]  How have you been since you were released from the hospital? Patient stated she is doing ok. Any questions or concerns? No  Items Reviewed: Did the pt receive and understand the discharge instructions provided? Yes  Medications obtained and verified? Yes  Other? No  Any new allergies since your discharge? No  Dietary orders reviewed? No Do you have support at home? Yes   Home Care and Equipment/Supplies: Were home health services ordered? not applicable If so, what is the name of the agency? N/A  Has the agency set up a time to come to the patient's home? not applicable Were any new equipment or medical supplies ordered?  No What is the name of the medical supply agency? N/A Were you able to get the supplies/equipment? not applicable Do you have any questions related to the use of the equipment or supplies? No  Functional Questionnaire: (I = Independent and D = Dependent) ADLs: I  Bathing/Dressing- I  Meal Prep- I  Eating- I  Maintaining continence- I  Transferring/Ambulation- I  Managing Meds- I  Follow up appointments reviewed:  PCP Hospital f/u appt confirmed? Yes  Scheduled to see Sherie Don on 09/05/2021 @ 2pm. Specialist [facility] f/u appt confirmed? Yes  Scheduled to see Dr. Despina Hidden on 09/22/2021 @ 9:50am. Are transportation arrangements needed? No  If their condition worsens, is the pt aware to call PCP or go to the Emergency Dept.? Yes Was the patient provided with contact information for the PCP's office or ED? Yes Was to pt encouraged to call back with questions or concerns? Yes

## 2021-09-09 ENCOUNTER — Encounter: Payer: Self-pay | Admitting: Nurse Practitioner

## 2021-09-16 NOTE — [facility] Provider Note (Signed)
Cc: abdominal pain    Amanda Mcmahon is an 25 y.o. female. G2P1001 No LMP recorded. Patient is pregnant. Amanda Mcmahon is a 25 y.o. female who was currently G2, P1 who just found today that she is pregnant by home pregnancy test presenting with complaints of low pelvic cramping pain which radiates into her back, nausea without emesis.  She was on Depo injections which were discontinued in April as she was discovered to have osteopenia.  She has noticed some breast tenderness, along with some abdominal distention and therefore took a home pregnancy test when she developed pain today and was surprised to discover she was positive.  She denies vaginal discharge or vaginal bleeding, doubts risk factors for STDs.  Her LMP was approximately 2 months ago, but states she has been historically very regular with her periods since coming off of the Depo Provera.  No other significant past medical history, no surgical history. She was evaluated at [facility] and diagnosed with ruptured ectopic pregnancy   Pertinent Gynecological History: Menses: flow is light Contraception: no Blood transfusions: none Sexually transmitted diseases: no past history Last pap: normal Date: 12/2020     Menstrual History:   No LMP recorded. Patient is pregnant.       Past Medical History:  Diagnosis Date   Medical history non-contributory     Osteopenia 02/09/2021    Discontinue provera, recheck bone density in 2024           Past Surgical History:  Procedure Laterality Date   NO PAST SURGERIES               Family History  Problem Relation Age of Onset   Arthritis Mother     Asthma Maternal Grandmother     Cancer Maternal Grandmother          lung   COPD Maternal Grandmother     Depression Maternal Grandmother     Diabetes Maternal Grandmother     Hypertension Maternal Grandmother     Stroke Maternal Grandmother     Hypertension Maternal Grandfather     Diabetes Paternal Grandmother        Social  History:  reports that she has never smoked. She has never used smokeless tobacco. She reports current alcohol use. She reports that she does not currently use drugs after having used the following drugs: Marijuana.   Allergies: No Known Allergies          Medications Prior to Admission  Medication Sig Dispense Refill Last Dose   acetaminophen (TYLENOL) 160 MG/5ML elixir Take 500 mg by mouth once.     08/30/2021 at 1000   medroxyPROGESTERone (DEPO-PROVERA) 150 MG/ML injection Inject 1 mL (150 mg total) into the muscle every 3 (three) months. 1 mL 3        Review of Systems   Blood pressure 125/75, pulse 93, temperature 98.1 F (36.7 C), temperature source Oral, resp. rate 18, height 5\' 3"  (1.6 m), weight 86.2 kg, SpO2 100 %. Physical Exam Vitals and nursing note reviewed.  Constitutional:      Appearance: She is well-developed.  HENT:     Head: Normocephalic.  Cardiovascular:     Rate and Rhythm: Normal rate.  Pulmonary:     Effort: Pulmonary effort is normal.  Abdominal:     General: Abdomen is flat.     Palpations: Abdomen is soft.     Tenderness: There is abdominal tenderness in the suprapubic area.  Skin:  General: Skin is warm and dry.  Neurological:     Mental Status: She is alert.  Psychiatric:        Mood and Affect: Mood normal.        Behavior: Behavior normal.      Lab Results Last 24 Hours       Results for orders placed or performed during the hospital encounter of 08/30/21 (from the past 24 hour(s))  Urinalysis, Routine w reflex microscopic Urine, Clean Catch     Status: Abnormal    Collection Time: 08/30/21  8:30 PM  Result Value Ref Range    Color, Urine YELLOW YELLOW    APPearance CLEAR CLEAR    Specific Gravity, Urine 1.020 1.005 - 1.030    pH 6.0 5.0 - 8.0    Glucose, UA NEGATIVE NEGATIVE mg/dL    Hgb urine dipstick NEGATIVE NEGATIVE    Bilirubin Urine NEGATIVE NEGATIVE    Ketones, ur 20 (A) NEGATIVE mg/dL    Protein, ur NEGATIVE NEGATIVE mg/dL     Nitrite NEGATIVE NEGATIVE    Leukocytes,Ua NEGATIVE NEGATIVE  Pregnancy, urine     Status: Abnormal    Collection Time: 08/30/21  8:30 PM  Result Value Ref Range    Preg Test, Ur POSITIVE (A) NEGATIVE  CBC with Differential/Platelet     Status: Abnormal    Collection Time: 08/30/21  8:52 PM  Result Value Ref Range    WBC 13.9 (H) 4.0 - 10.5 K/uL    RBC 4.32 3.87 - 5.11 MIL/uL    Hemoglobin 14.1 12.0 - 15.0 g/dL    HCT 94.8 54.6 - 27.0 %    MCV 93.3 80.0 - 100.0 fL    MCH 32.6 26.0 - 34.0 pg    MCHC 35.0 30.0 - 36.0 g/dL    RDW 35.0 09.3 - 81.8 %    Platelets 303 150 - 400 K/uL    nRBC 0.0 0.0 - 0.2 %    Neutrophils Relative % 83 %    Neutro Abs 11.6 (H) 1.7 - 7.7 K/uL    Lymphocytes Relative 9 %    Lymphs Abs 1.2 0.7 - 4.0 K/uL    Monocytes Relative 7 %    Monocytes Absolute 0.9 0.1 - 1.0 K/uL    Eosinophils Relative 0 %    Eosinophils Absolute 0.0 0.0 - 0.5 K/uL    Basophils Relative 0 %    Basophils Absolute 0.0 0.0 - 0.1 K/uL    Immature Granulocytes 1 %    Abs Immature Granulocytes 0.10 (H) 0.00 - 0.07 K/uL  Comprehensive metabolic panel     Status: Abnormal    Collection Time: 08/30/21  8:52 PM  Result Value Ref Range    Sodium 134 (L) 135 - 145 mmol/L    Potassium 3.8 3.5 - 5.1 mmol/L    Chloride 106 98 - 111 mmol/L    CO2 21 (L) 22 - 32 mmol/L    Glucose, Bld 109 (H) 70 - 99 mg/dL    BUN 5 (L) 6 - 20 mg/dL    Creatinine, Ser 2.99 0.44 - 1.00 mg/dL    Calcium 8.9 8.9 - 37.1 mg/dL    Total Protein 7.0 6.5 - 8.1 g/dL    Albumin 3.8 3.5 - 5.0 g/dL    AST 19 15 - 41 U/L    ALT 25 0 - 44 U/L    Alkaline Phosphatase 94 38 - 126 U/L    Total Bilirubin 1.4 (H) 0.3 - 1.2 mg/dL  GFR, Estimated >60 >60 mL/min    Anion gap 7 5 - 15  hCG, quantitative, pregnancy     Status: Abnormal    Collection Time: 08/30/21  8:52 PM  Result Value Ref Range    hCG, Beta Chain, Quant, S 602 (H) <5 mIU/mL  Wet prep, genital     Status: Abnormal    Collection Time: 08/30/21 10:50  PM    Specimen: PATH Cytology Cervicovaginal Ancillary Only; Genital  Result Value Ref Range    Yeast Wet Prep HPF POC NONE SEEN NONE SEEN    Trich, Wet Prep NONE SEEN NONE SEEN    Clue Cells Wet Prep HPF POC NONE SEEN NONE SEEN    WBC, Wet Prep HPF POC <10 (A) NONE SEEN    Sperm NONE SEEN    Resp Panel by RT-PCR (Flu A&B, Covid) Nasopharyngeal Swab     Status: None    Collection Time: 08/31/21 12:20 AM    Specimen: Nasopharyngeal Swab; Nasopharyngeal(NP) swabs in vial transport medium  Result Value Ref Range    SARS Coronavirus 2 by RT PCR NEGATIVE NEGATIVE    Influenza A by PCR NEGATIVE NEGATIVE    Influenza B by PCR NEGATIVE NEGATIVE         Imaging Results (Last 48 hours)  US OB Comp < 14 Wks   Result Date: 08/30/2021 CLINICAL DATA:  Pregnant patient with pelvic pain.  Beta HCG 602 EXAM: OBSTETRIC <14 WK [place] AND TRANSVAGINAL OB US TECHNIQUE: Both transabdominal and transvaginal ultrasound examinations were performed for complete evaluation of the gestation as well as the maternal uterus, adnexal regions, and pelvic cul-de-sac. Transvaginal technique was performed to assess early pregnancy. COMPARISON:  None this pregnancy FINDINGS: Intrauterine gestational sac: Complex fluid and echogenic debris in the endometrial canal but no gestational sac. Yolk sac:  Not Visualized. Embryo:  Not Visualized. Cardiac Activity: Not Visualized. Maternal uterus/adnexae: There is complex fluid and echogenic debris within the endometrial canal but no gestational sac. Large amount of complex free fluid consistent with blood products in the pelvis. There is echogenic material adjacent to the right ovary without well-defined adnexal mass. Cystic structure in the right ovary with peripheral flow measuring 1.6 cm is favored to represent a corpus luteal or hemorrhagic cyst. The left ovary appears normal. Ovarian blood flow is seen. IMPRESSION: 1. Large amount of blood in the pelvis, with echogenic material adjacent  to the right ovary. While there is no definitive adnexal mass, findings are highly suspicious for ruptured ectopic pregnancy on the right. 2. Right ovarian cyst with peripheral blood flow measuring 1.6 cm is favored to represent a corpus luteal or hemorrhagic cyst rather than an ovarian ectopic. Ruptured hemorrhagic cyst as cause of pelvic hemorrhage is not entirely excluded, however in this setting ruptured ectopic is favored. 3. Complex fluid and echogenic debris in the endometrial canal without intrauterine gestational sac. Critical Value/emergent results were called by telephone at the time of interpretation on 08/30/2021 at 11:39 pm to provider JULIE IDOL , who verbally acknowledged these results. Electronically Signed   By: Narda Rutherford M.D.   On: 08/30/2021 23:41    US OB Transvaginal   Result Date: 08/30/2021 CLINICAL DATA:  Pregnant patient with pelvic pain.  Beta HCG 602 EXAM: OBSTETRIC <14 WK [place] AND TRANSVAGINAL OB US TECHNIQUE: Both transabdominal and transvaginal ultrasound examinations were performed for complete evaluation of the gestation as well as the maternal uterus, adnexal regions, and pelvic cul-de-sac. Transvaginal technique was performed to assess  early pregnancy. COMPARISON:  None this pregnancy FINDINGS: Intrauterine gestational sac: Complex fluid and echogenic debris in the endometrial canal but no gestational sac. Yolk sac:  Not Visualized. Embryo:  Not Visualized. Cardiac Activity: Not Visualized. Maternal uterus/adnexae: There is complex fluid and echogenic debris within the endometrial canal but no gestational sac. Large amount of complex free fluid consistent with blood products in the pelvis. There is echogenic material adjacent to the right ovary without well-defined adnexal mass. Cystic structure in the right ovary with peripheral flow measuring 1.6 cm is favored to represent a corpus luteal or hemorrhagic cyst. The left ovary appears normal. Ovarian blood flow is seen.  IMPRESSION: 1. Large amount of blood in the pelvis, with echogenic material adjacent to the right ovary. While there is no definitive adnexal mass, findings are highly suspicious for ruptured ectopic pregnancy on the right. 2. Right ovarian cyst with peripheral blood flow measuring 1.6 cm is favored to represent a corpus luteal or hemorrhagic cyst rather than an ovarian ectopic. Ruptured hemorrhagic cyst as cause of pelvic hemorrhage is not entirely excluded, however in this setting ruptured ectopic is favored. 3. Complex fluid and echogenic debris in the endometrial canal without intrauterine gestational sac. Critical Value/emergent results were called by telephone at the time of interpretation on 08/30/2021 at 11:39 pm to provider JULIE IDOL , who verbally acknowledged these results. Electronically Signed   By: Narda Rutherford M.D.   On: 08/30/2021 23:41       Assessment/Plan: Suspected ruptured ectopic pregnancy. Needs to be managed with laparoscopy, removal with possible salpingectomy. Patient desires surgical management  The risks of surgery were discussed in detail with the patient including but not limited to: bleeding which may require transfusion or reoperation; infection which may require prolonged hospitalization or re-hospitalization and antibiotic therapy; injury to bowel, bladder, ureters and major vessels or other surrounding organs; formation of adhesions; need for additional procedures including laparotomy or subsequent procedures secondary to abnormal pathology; thromboembolic phenomenon; incisional problems and other postoperative or anesthesia complications.  Patient was told that the likelihood that her condition and symptoms will be treated effectively with this surgical management was very high; the postoperative expectations were also discussed in detail. The patient also understands the alternative treatment options which were discussed in full. All questions were answered.        Scheryl Darter 08/31/2021, 2:11 AM

## 2021-09-22 ENCOUNTER — Other Ambulatory Visit: Payer: Self-pay

## 2021-09-22 ENCOUNTER — Encounter: Payer: Self-pay | Admitting: Obstetrics & Gynecology

## 2021-09-22 ENCOUNTER — Ambulatory Visit (INDEPENDENT_AMBULATORY_CARE_PROVIDER_SITE_OTHER): Payer: BLUE CROSS/BLUE SHIELD | Admitting: Obstetrics & Gynecology

## 2021-09-22 VITALS — BP 118/79 | HR 76 | Ht 63.0 in | Wt 185.5 lb

## 2021-09-22 DIAGNOSIS — Z30013 Encounter for initial prescription of injectable contraceptive: Secondary | ICD-10-CM | POA: Diagnosis not present

## 2021-09-22 DIAGNOSIS — Z9889 Other specified postprocedural states: Secondary | ICD-10-CM

## 2021-09-22 DIAGNOSIS — Z3202 Encounter for pregnancy test, result negative: Secondary | ICD-10-CM

## 2021-09-22 LAB — POCT URINE PREGNANCY: Preg Test, Ur: NEGATIVE

## 2021-09-22 MED ORDER — MEDROXYPROGESTERONE ACETATE 150 MG/ML IM SUSP
150.0000 mg | Freq: Once | INTRAMUSCULAR | Status: AC
  Administered 2021-09-22: 150 mg via INTRAMUSCULAR

## 2021-09-22 NOTE — Progress Notes (Signed)
   HPI: Patient returns for routine postoperative follow-up having undergone laparoscopic right salpingectomy for ruptured right ectopic pregnancy  on 08/31/21.  The patient's immediate postoperative recovery has been unremarkable. Since hospital discharge the patient reports no problems.   Current Outpatient Medications: HYDROcodone-acetaminophen (NORCO) 10-325 MG tablet, Take 1 tablet by mouth every 6 (six) hours as needed., Disp: 15 tablet, Rfl: 0 ibuprofen (ADVIL) 600 MG tablet, Take 1 tablet (600 mg total) by mouth every 6 (six) hours as needed., Disp: 60 tablet, Rfl: 3  Current Facility-Administered Medications: medroxyPROGESTERone (DEPO-PROVERA) injection 150 mg, 150 mg, Intramuscular, Once, Emeterio Balke, Amaryllis Dyke, MD     Blood pressure 118/79, pulse 76, height 5\' 3"  (1.6 m), weight 185 lb 8 oz (84.1 kg), last menstrual period 12/30/2020, not currently breastfeeding.  Physical Exam: 3 incisions all look good  Diagnostic Tests:   Pathology: Ectopic pregnancy  Impression:     ICD-10-CM   1. S/P Laparoscopic right salpingectomy for ruptured right ectopic pregnancy: 08/31/21  Z98.890     2. Pregnancy examination or test, negative result  Z32.02 POCT urine pregnancy    3. Encounter for initial prescription of injectable contraceptive  Z30.013 medroxyPROGESTERone (DEPO-PROVERA) injection 150 mg          Plan: Orders Placed This Encounter     medroxyPROGESTERone (DEPO-PROVERA) injection 150 mg    Follow up: No follow-ups on file.   09/02/21, MD

## 2021-11-20 ENCOUNTER — Encounter: Payer: Self-pay | Admitting: Obstetrics & Gynecology

## 2021-11-20 ENCOUNTER — Other Ambulatory Visit: Payer: Self-pay

## 2021-11-20 ENCOUNTER — Ambulatory Visit (INDEPENDENT_AMBULATORY_CARE_PROVIDER_SITE_OTHER): Payer: BLUE CROSS/BLUE SHIELD | Admitting: Obstetrics & Gynecology

## 2021-11-20 VITALS — BP 117/90 | HR 106 | Ht 63.0 in | Wt 181.0 lb

## 2021-11-20 DIAGNOSIS — Z9889 Other specified postprocedural states: Secondary | ICD-10-CM | POA: Diagnosis not present

## 2021-11-20 DIAGNOSIS — R1031 Right lower quadrant pain: Secondary | ICD-10-CM

## 2021-11-20 MED ORDER — KETOROLAC TROMETHAMINE 10 MG PO TABS: 10.0000 mg | ORAL_TABLET | Freq: Three times a day (TID) | ORAL | 0 refills | Status: DC | PRN

## 2021-11-20 NOTE — Progress Notes (Signed)
Chief Complaint  Patient presents with   Follow-up   Pelvic Pain      26 y.o. G2P1011 Patient's last menstrual period was 11/14/2021 (exact date). The current method of family planning is Depo-Provera injections.  Outpatient Encounter Medications as of 11/20/2021  Medication Sig   acetaminophen (TYLENOL) 500 MG tablet Take 500 mg by mouth every 6 (six) hours as needed.   ketorolac (TORADOL) 10 MG tablet Take 1 tablet (10 mg total) by mouth every 8 (eight) hours as needed.   medroxyPROGESTERone (DEPO-PROVERA) 150 MG/ML injection Inject 150 mg into the muscle every 3 (three) months.   ibuprofen (ADVIL) 600 MG tablet Take 1 tablet (600 mg total) by mouth every 6 (six) hours as needed. (Patient not taking: Reported on 11/20/2021)   [DISCONTINUED] HYDROcodone-acetaminophen (NORCO) 10-325 MG tablet Take 1 tablet by mouth every 6 (six) hours as needed.   No facility-administered encounter medications on file as of 11/20/2021.    Subjective Pt with some RLQ pain sharp which started the day before her cycle Comes and goes sharp Bleeding is heavy, she is on depo Normally has cramps not sharp one sided pain Past Medical History:  Diagnosis Date   Medical history non-contributory    Osteopenia 02/09/2021   Discontinue provera, recheck bone density in 2024    Past Surgical History:  Procedure Laterality Date   LAPAROSCOPIC UNILATERAL SALPINGECTOMY Right 08/31/2021   Procedure: LAPAROSCOPIC RIGHT SALPINGECTOMY WITH REMOVAL OF ECTOPIC PREGNANCY;  Surgeon: Woodroe Mode, MD;  Location: [facility];  Service: Gynecology;  Laterality: Right;   NO PAST SURGERIES      OB History     Gravida  2   Para  1   Term  1   Preterm      AB  1   Living  1      SAB      IAB      Ectopic  1   Multiple  0   Live Births  1           No Known Allergies  Social History   Socioeconomic History   Marital status: Single    Spouse name: Not on file   Number of children: 1    Years of education: Not on file   Highest education level: Not on file  Occupational History   Not on file  Tobacco Use   Smoking status: Never   Smokeless tobacco: Never  Vaping Use   Vaping Use: Never used  Substance and Sexual Activity   Alcohol use: Yes   Drug use: Not Currently    Types: Marijuana    Comment: socially   Sexual activity: Yes    Birth control/protection: Injection  Other Topics Concern   Not on file  Social History Narrative   Not on file   Social Determinants of Health   Financial Resource Strain: Low Risk    Difficulty of Paying Living Expenses: Not very hard  Food Insecurity: No Food Insecurity   Worried About Running Out of Food in the Last Year: Never true   Ran Out of Food in the Last Year: Never true  Transportation Needs: No Transportation Needs   Lack of Transportation (Medical): No   Lack of Transportation (Non-Medical): No  Physical Activity: Sufficiently Active   Days of Exercise per Week: 3 days   Minutes of Exercise per Session: 60 min  Stress: No Stress Concern Present   Feeling of Stress : Not at  all  Social Connections: Socially Isolated   Frequency of Communication with Friends and Family: Three times a week   Frequency of Social Gatherings with Friends and Family: Three times a week   Attends Religious Services: Never   Active Member of Clubs or Organizations: No   Attends Archivist Meetings: Never   Marital Status: Never married    Family History  Problem Relation Age of Onset   Diabetes Paternal Grandmother    Asthma Maternal Grandmother    Cancer Maternal Grandmother        lung   COPD Maternal Grandmother    Depression Maternal Grandmother    Diabetes Maternal Grandmother    Hypertension Maternal Grandmother    Stroke Maternal Grandmother    Hypertension Maternal Grandfather    Arthritis Mother     Medications:       Current Outpatient Medications:    acetaminophen (TYLENOL) 500 MG tablet, Take 500 mg  by mouth every 6 (six) hours as needed., Disp: , Rfl:    ketorolac (TORADOL) 10 MG tablet, Take 1 tablet (10 mg total) by mouth every 8 (eight) hours as needed., Disp: 15 tablet, Rfl: 0   medroxyPROGESTERone (DEPO-PROVERA) 150 MG/ML injection, Inject 150 mg into the muscle every 3 (three) months., Disp: , Rfl:    ibuprofen (ADVIL) 600 MG tablet, Take 1 tablet (600 mg total) by mouth every 6 (six) hours as needed. (Patient not taking: Reported on 11/20/2021), Disp: 60 tablet, Rfl: 3  Objective Blood pressure 117/90, pulse (!) 106, height 5\' 3"  (1.6 m), weight 181 lb (82.1 kg), last menstrual period 11/14/2021.  General WDWN female NAD Vulva:  normal appearing vulva with no masses, tenderness or lesions Vagina:  normal mucosa, no discharge Cervix:  Normal no lesions Uterus:  normal size, contour, position, consistency, mobility, non-tender Adnexa: ovaries:present,  normal adnexa in size, nontender and no masses  No masses palpated  Pertinent ROS No burning with urination, frequency or urgency No nausea, vomiting or diarrhea Nor fever chills or other constitutional symptoms   Labs or studies No new    Impression Diagnoses this Encounter::   ICD-10-CM   1. RLQ  pain with menses  R10.31    First cycle since ectopic, everything feels normal no masses or tenderness on exam is noted    2. S/P Laparoscopic right salpingectomy for ruptured right ectopic pregnancy: 08/31/21  Z98.890       Established relevant diagnosis(es):   Plan/Recommendations: Meds ordered this encounter  Medications   ketorolac (TORADOL) 10 MG tablet    Sig: Take 1 tablet (10 mg total) by mouth every 8 (eight) hours as needed.    Dispense:  15 tablet    Refill:  0    Labs or Scans Ordered: No orders of the defined types were placed in this encounter.   Management:: Does not seem serious likely related to Depo first period after surgery, toradol given  Follow up Return if symptoms worsen or fail to  improve.       All questions were answered.

## 2021-12-11 ENCOUNTER — Other Ambulatory Visit: Payer: Self-pay | Admitting: Adult Health

## 2021-12-11 ENCOUNTER — Ambulatory Visit (INDEPENDENT_AMBULATORY_CARE_PROVIDER_SITE_OTHER): Payer: BLUE CROSS/BLUE SHIELD | Admitting: *Deleted

## 2021-12-11 ENCOUNTER — Other Ambulatory Visit: Payer: Self-pay

## 2021-12-11 DIAGNOSIS — Z3042 Encounter for surveillance of injectable contraceptive: Secondary | ICD-10-CM

## 2021-12-11 MED ORDER — MEDROXYPROGESTERONE ACETATE 150 MG/ML IM SUSP
150.0000 mg | Freq: Once | INTRAMUSCULAR | Status: AC
  Administered 2021-12-11: 150 mg via INTRAMUSCULAR

## 2021-12-11 MED ORDER — MEDROXYPROGESTERONE ACETATE 150 MG/ML IM SUSP: 150.0000 mg | INTRAMUSCULAR | 4 refills | Status: DC

## 2021-12-11 NOTE — Progress Notes (Signed)
° °  NURSE VISIT- INJECTION  SUBJECTIVE:  Amanda Mcmahon is a 26 y.o. G60P1011 female here for a Depo Provera for contraception/period management. She is a GYN patient.   OBJECTIVE:  LMP 11/14/2021 (Exact Date)   Appears well, in no apparent distress  Injection administered in: Right deltoid  Meds ordered this encounter  Medications   medroxyPROGESTERone (DEPO-PROVERA) injection 150 mg    ASSESSMENT: GYN patient Depo Provera for contraception/period management PLAN: Follow-up: in 11-13 weeks for next Depo   Malachy Mood  12/11/2021 2:44 PM

## 2021-12-11 NOTE — Progress Notes (Signed)
Refill depo  °

## 2022-03-05 ENCOUNTER — Ambulatory Visit (INDEPENDENT_AMBULATORY_CARE_PROVIDER_SITE_OTHER): Payer: BLUE CROSS/BLUE SHIELD

## 2022-03-05 DIAGNOSIS — Z3042 Encounter for surveillance of injectable contraceptive: Secondary | ICD-10-CM

## 2022-03-05 MED ORDER — MEDROXYPROGESTERONE ACETATE 150 MG/ML IM SUSP
150.0000 mg | Freq: Once | INTRAMUSCULAR | Status: AC
  Administered 2022-03-05: 150 mg via INTRAMUSCULAR

## 2022-03-05 NOTE — Progress Notes (Signed)
? ?  NURSE VISIT- INJECTION ? ?SUBJECTIVE:  ?Amanda Mcmahon is a 26 y.o. G37P1011 female here for a Depo Provera for contraception/period management. She is a GYN patient.  ? ?OBJECTIVE:  ?There were no vitals taken for this visit.  ?Appears well, in no apparent distress ? ?Injection administered in: Left deltoid ? ?Meds ordered this encounter  ?Medications  ? medroxyPROGESTERone (DEPO-PROVERA) injection 150 mg  ? ? ?ASSESSMENT: ?GYN patient Depo Provera for contraception/period management ?PLAN: ?Follow-up: in 11-13 weeks for next Depo  ? ?Dhaval Woo A Moesha Sarchet  ?03/05/2022 ?4:16 PM ? ?

## 2022-05-14 ENCOUNTER — Ambulatory Visit: Payer: BLUE CROSS/BLUE SHIELD | Admitting: Adult Health

## 2022-05-15 ENCOUNTER — Ambulatory Visit
Admission: EM | Admit: 2022-05-15 | Discharge: 2022-05-15 | Disposition: A | Discharge status: Home / Self Care | Payer: BLUE CROSS/BLUE SHIELD | Attending: Family Medicine | Admitting: Family Medicine

## 2022-05-15 ENCOUNTER — Encounter: Payer: Self-pay | Admitting: Emergency Medicine

## 2022-05-15 DIAGNOSIS — R599 Enlarged lymph nodes, unspecified: Secondary | ICD-10-CM | POA: Diagnosis not present

## 2022-05-15 MED ORDER — AMOXICILLIN-POT CLAVULANATE 875-125 MG PO TABS: 1.0000 | ORAL_TABLET | Freq: Two times a day (BID) | ORAL | 0 refills | Status: DC

## 2022-05-15 NOTE — ED Provider Notes (Signed)
[facility]    CSN: [id number] Arrival date & time: 05/15/22  8416      History   Chief Complaint No chief complaint on file.   HPI Amanda Mcmahon is a 26 y.o. female.   Presenting today with 2-day history of swollen, tender lymph nodes beside right ear, right side of neck.  Has noticed some bilateral ear pain and pressure but otherwise no sore throat, dental issues that she is aware of, fever, chills, weight loss, night sweats.  Denies any past history of similar issues.  Not tried anything over-the-counter for symptoms thus far.    Past Medical History:  Diagnosis Date   Medical history non-contributory    Osteopenia 02/09/2021   Discontinue provera, recheck bone density in 2024    Patient Active Problem List   Diagnosis Date Noted   Ectopic pregnancy 08/31/2021   Osteopenia 02/09/2021   Encounter for health maintenance examination 07/17/2020   Encounter for surveillance of injectable contraceptive 07/17/2020    Past Surgical History:  Procedure Laterality Date   LAPAROSCOPIC UNILATERAL SALPINGECTOMY Right 08/31/2021   Procedure: LAPAROSCOPIC RIGHT SALPINGECTOMY WITH REMOVAL OF ECTOPIC PREGNANCY;  Surgeon: Adam Phenix, MD;  Location: [facility] OR;  Service: Gynecology;  Laterality: Right;   NO PAST SURGERIES      OB History     Gravida  2   Para  1   Term  1   Preterm      AB  1   Living  1      SAB      IAB      Ectopic  1   Multiple  0   Live Births  1            Home Medications    Prior to Admission medications   Medication Sig Start Date End Date Taking? Authorizing Provider  amoxicillin-clavulanate (AUGMENTIN) 875-125 MG tablet Take 1 tablet by mouth every 12 (twelve) hours. 05/15/22  Yes Particia Nearing, PA-C  acetaminophen (TYLENOL) 500 MG tablet Take 500 mg by mouth every 6 (six) hours as needed.    [provider]  medroxyPROGESTERone (DEPO-PROVERA) 150 MG/ML injection Inject 1 mL (150 mg total) into  the muscle every 3 (three) months. 12/11/21   Adline Potter, NP    Family History Family History  Problem Relation Age of Onset   Diabetes Paternal Grandmother    Asthma Maternal Grandmother    Cancer Maternal Grandmother        lung   COPD Maternal Grandmother    Depression Maternal Grandmother    Diabetes Maternal Grandmother    Hypertension Maternal Grandmother    Stroke Maternal Grandmother    Hypertension Maternal Grandfather    Arthritis Mother     Social History Social History   Tobacco Use   Smoking status: Never   Smokeless tobacco: Never  Vaping Use   Vaping Use: Never used  Substance Use Topics   Alcohol use: Yes   Drug use: Not Currently    Types: Marijuana    Comment: socially     Allergies   Patient has no known allergies.   Review of Systems Review of Systems Per HPI  Physical Exam Triage Vital Signs ED Triage Vitals  Enc Vitals Group     BP 05/15/22 0842 112/75     Pulse Rate 05/15/22 0842 97     Resp 05/15/22 0842 18     Temp 05/15/22 0842 98.3 F (36.8 C)  Temp Source 05/15/22 0842 Oral     SpO2 05/15/22 0842 99 %     Weight --      Height --      Head Circumference --      Peak Flow --      Pain Score 05/15/22 0843 3     Pain Loc --      Pain Edu? --      Excl. in GC? --    No data found.  Updated Vital Signs BP 112/75 (BP Location: Right Arm)   Pulse 97   Temp 98.3 F (36.8 C) (Oral)   Resp 18   SpO2 99%   Visual Acuity Right Eye Distance:   Left Eye Distance:   Bilateral Distance:    Right Eye Near:   Left Eye Near:    Bilateral Near:     Physical Exam Vitals and nursing note reviewed.  Constitutional:      Appearance: Normal appearance. She is not ill-appearing.  HENT:     Head: Atraumatic.     Right Ear: Tympanic membrane normal.     Left Ear: Tympanic membrane normal.     Nose: Nose normal.     Mouth/Throat:     Mouth: Mucous membranes are moist.     Pharynx: No oropharyngeal exudate or  posterior oropharyngeal erythema.     Comments: No obvious dental infections Eyes:     Extraocular Movements: Extraocular movements intact.     Conjunctiva/sclera: Conjunctivae normal.  Cardiovascular:     Rate and Rhythm: Normal rate and regular rhythm.     Heart sounds: Normal heart sounds.  Pulmonary:     Effort: Pulmonary effort is normal.     Breath sounds: Normal breath sounds.  Musculoskeletal:        General: Normal range of motion.     Cervical back: Normal range of motion and neck supple.  Lymphadenopathy:     Head:     Right side of head: Preauricular adenopathy present.     Cervical: Cervical adenopathy present.     Right cervical: Superficial cervical adenopathy present.  Skin:    General: Skin is warm and dry.  Neurological:     Mental Status: She is alert and oriented to person, place, and time.  Psychiatric:        Mood and Affect: Mood normal.        Thought Content: Thought content normal.        Judgment: Judgment normal.      UC Treatments / Results  Labs (all labs ordered are listed, but only abnormal results are displayed) Labs Reviewed - No data to display  EKG   Radiology No results found.  Procedures Procedures (including critical care time)  Medications Ordered in UC Medications - No data to display  Initial Impression / Assessment and Plan / UC Course  I have reviewed the triage vital signs and the nursing notes.  Pertinent labs & imaging results that were available during my care of the patient were reviewed by me and considered in my medical decision making (see chart for details).     Overall exam and vital signs reassuring apart from lymphadenopathy.  Given unclear nature, will start Augmentin, ibuprofen, warm compresses and monitor for improvement.  Follow-up with PCP for recheck if not fully resolving.  Discussed that could also be inflammatory or viral in nature.  Return sooner for worsening symptoms.  Final Clinical  Impressions(s) / UC Diagnoses   Final diagnoses:  Adenopathy     Discharge Instructions      Try warm compresses, ibuprofen as well.  Follow-up with primary care if not fully resolving    ED Prescriptions     Medication Sig Dispense Auth. Provider   amoxicillin-clavulanate (AUGMENTIN) 875-125 MG tablet Take 1 tablet by mouth every 12 (twelve) hours. 14 tablet Volney American, [place]      PDMP not reviewed this encounter.   Merrie Roof [place], [place] 05/15/22 [phone]

## 2022-05-15 NOTE — ED Triage Notes (Signed)
Swollen lymph node on right side of face x 2 days.

## 2022-05-15 NOTE — Discharge Instructions (Signed)
Try warm compresses, ibuprofen as well.  Follow-up with primary care if not fully resolving

## 2022-05-20 ENCOUNTER — Ambulatory Visit (INDEPENDENT_AMBULATORY_CARE_PROVIDER_SITE_OTHER): Payer: BLUE CROSS/BLUE SHIELD | Admitting: Adult Health

## 2022-05-20 ENCOUNTER — Other Ambulatory Visit (HOSPITAL_COMMUNITY)
Admission: RE | Admit: 2022-05-20 | Discharge: 2022-05-20 | Disposition: A | Discharge status: Home / Self Care | Payer: BLUE CROSS/BLUE SHIELD | Source: Ambulatory Visit | Attending: Adult Health | Admitting: Adult Health

## 2022-05-20 ENCOUNTER — Encounter: Payer: Self-pay | Admitting: Adult Health

## 2022-05-20 VITALS — BP 120/81 | HR 84 | Ht 63.0 in | Wt 174.5 lb

## 2022-05-20 DIAGNOSIS — Z113 Encounter for screening for infections with a predominantly sexual mode of transmission: Secondary | ICD-10-CM | POA: Insufficient documentation

## 2022-05-20 DIAGNOSIS — Z3202 Encounter for pregnancy test, result negative: Secondary | ICD-10-CM | POA: Insufficient documentation

## 2022-05-20 DIAGNOSIS — N926 Irregular menstruation, unspecified: Secondary | ICD-10-CM | POA: Insufficient documentation

## 2022-05-20 LAB — POCT URINE PREGNANCY: Preg Test, Ur: NEGATIVE

## 2022-05-20 NOTE — Progress Notes (Signed)
  Subjective:     Patient ID: Amanda Mcmahon, female   DOB: 03/24/1996, 26 y.o.   MRN: [id number]  HPI Amanda Mcmahon is a 26 year old black female,single, G2P1011 in complaining of irregular bleeding with depo, but it stopped, and has had some cramping. Lab Results  Component Value Date   DIAGPAP  01/23/2021    - Negative for intraepithelial lesion or malignancy (NILM)   HPVHIGH Positive (A) 01/23/2021   PCP is Dr Lilyan Punt.  Review of Systems Was having irregular bleeding with depo, but it stopped Has some cramping at times  Reviewed past medical,surgical, social and family history. Reviewed medications and allergies.     Objective:   Physical Exam BP 120/81 (BP Location: Left Arm, Patient Position: Sitting, Cuff Size: Normal)   Pulse 84   Ht 5\' 3"  (1.6 m)   Wt 174 lb 8 oz (79.2 kg)   BMI 30.91 kg/m  UPT is negative.  Skin warm and dry.Pelvic: external genitalia is normal in appearance no lesions, vagina:pink discharge without odor,urethra has no lesions or masses noted, cervix: bulbous, uterus: normal size, shape and contour, non tender, no masses felt, adnexa: no masses or tenderness noted. Bladder is non tender and no masses felt. CV swab obtained.  Upstream - 05/20/22 1411       Pregnancy Intention Screening   Does the patient want to become pregnant in the next year? Ok Either Way    Does the patient's partner want to become pregnant in the next year? Ok Either Way    Would the patient like to discuss contraceptive options today? No      Contraception Wrap Up   Current Method Hormonal Injection    End Method Hormonal Injection    Contraception Counseling Provided No            Examination chaperoned by 05/22/22 LPN     Assessment:     1. Pregnancy examination or test, negative result - POCT urine pregnancy  2. Irregular bleeding Bleeding has resolved, she is on depo  CV swab sent  - Cervicovaginal ancillary only( [place])  3. Screening examination for  STD (sexually transmitted disease) She requests STD testing  CV swab sent for GC/CHL and trich and will check labs  - Cervicovaginal ancillary only( [facility]) - Hepatitis C antibody - Hepatitis B surface antigen - HIV Antibody (routine testing w rflx) - RPR     Plan:     Return 05/28/22 for depo as scheduled

## 2022-05-21 LAB — HEPATITIS B SURFACE ANTIGEN: Hepatitis B Surface Ag: NEGATIVE

## 2022-05-21 LAB — RPR: RPR Ser Ql: NONREACTIVE

## 2022-05-21 LAB — HEPATITIS C ANTIBODY: Hep C Virus Ab: NONREACTIVE

## 2022-05-21 LAB — HIV ANTIBODY (ROUTINE TESTING W REFLEX): HIV Screen 4th Generation wRfx: NONREACTIVE

## 2022-05-22 LAB — CERVICOVAGINAL ANCILLARY ONLY
Chlamydia: NEGATIVE
Comment: NEGATIVE
Comment: NEGATIVE
Comment: NORMAL
Neisseria Gonorrhea: NEGATIVE
Trichomonas: NEGATIVE

## 2022-05-28 ENCOUNTER — Ambulatory Visit (INDEPENDENT_AMBULATORY_CARE_PROVIDER_SITE_OTHER): Payer: BLUE CROSS/BLUE SHIELD | Admitting: *Deleted

## 2022-05-28 DIAGNOSIS — Z3042 Encounter for surveillance of injectable contraceptive: Secondary | ICD-10-CM

## 2022-05-28 MED ORDER — MEDROXYPROGESTERONE ACETATE 150 MG/ML IM SUSP
150.0000 mg | Freq: Once | INTRAMUSCULAR | Status: AC
  Administered 2022-05-28: 150 mg via INTRAMUSCULAR

## 2022-05-28 NOTE — Progress Notes (Signed)
   NURSE VISIT- INJECTION  SUBJECTIVE:  Amanda Mcmahon is a 26 y.o. G20P1011 female here for a Depo Provera for contraception/period management. She is a GYN patient.   OBJECTIVE:  There were no vitals taken for this visit.  Appears well, in no apparent distress  Injection administered in: Left deltoid  Meds ordered this encounter  Medications   medroxyPROGESTERone (DEPO-PROVERA) injection 150 mg    ASSESSMENT: GYN patient Depo Provera for contraception/period management PLAN: Follow-up: in 11-13 weeks for next Depo   Jobe Marker  05/28/2022 4:33 PM

## 2022-08-18 ENCOUNTER — Ambulatory Visit: Payer: BLUE CROSS/BLUE SHIELD

## 2022-08-18 DIAGNOSIS — Z0289 Encounter for other administrative examinations: Secondary | ICD-10-CM

## 2022-08-31 ENCOUNTER — Ambulatory Visit: Payer: BLUE CROSS/BLUE SHIELD

## 2022-09-01 ENCOUNTER — Ambulatory Visit (INDEPENDENT_AMBULATORY_CARE_PROVIDER_SITE_OTHER): Payer: BLUE CROSS/BLUE SHIELD | Admitting: *Deleted

## 2022-09-01 DIAGNOSIS — Z3042 Encounter for surveillance of injectable contraceptive: Secondary | ICD-10-CM | POA: Diagnosis not present

## 2022-09-01 MED ORDER — MEDROXYPROGESTERONE ACETATE 150 MG/ML IM SUSP
150.0000 mg | Freq: Once | INTRAMUSCULAR | Status: AC
  Administered 2022-09-01: 150 mg via INTRAMUSCULAR

## 2022-09-01 NOTE — Progress Notes (Signed)
   NURSE VISIT- INJECTION  SUBJECTIVE:  Amanda Mcmahon is a 26 y.o. G70P1011 female here for a Depo Provera for contraception/period management. She is a GYN patient.   OBJECTIVE:  There were no vitals taken for this visit.  Appears well, in no apparent distress  Injection administered in: Left deltoid  Meds ordered this encounter  Medications   medroxyPROGESTERone (DEPO-PROVERA) injection 150 mg    ASSESSMENT: GYN patient Depo Provera for contraception/period management PLAN: Follow-up: in 11-13 weeks for next Depo   Alice Rieger  09/01/2022 11:39 AM

## 2022-09-02 ENCOUNTER — Ambulatory Visit
Admission: EM | Admit: 2022-09-02 | Discharge: 2022-09-02 | Disposition: A | Discharge status: Home / Self Care | Payer: BLUE CROSS/BLUE SHIELD | Attending: Nurse Practitioner | Admitting: Nurse Practitioner

## 2022-09-02 DIAGNOSIS — R519 Headache, unspecified: Secondary | ICD-10-CM | POA: Diagnosis not present

## 2022-09-02 DIAGNOSIS — J029 Acute pharyngitis, unspecified: Secondary | ICD-10-CM | POA: Insufficient documentation

## 2022-09-02 LAB — POCT RAPID STREP A (OFFICE): Rapid Strep A Screen: NEGATIVE

## 2022-09-02 MED ORDER — CETIRIZINE HCL 10 MG PO TABS: 10.0000 mg | ORAL_TABLET | Freq: Every day | ORAL | 0 refills | Status: DC

## 2022-09-02 MED ORDER — LIDOCAINE VISCOUS HCL 2 % MT SOLN: 15.0000 mL | OROMUCOSAL | 0 refills | Status: DC | PRN

## 2022-09-02 NOTE — ED Provider Notes (Signed)
[facility]    CSN: [id number] Arrival date & time: 09/02/22  1610      History   Chief Complaint Chief Complaint  Patient presents with   Headache   Sore Throat         HPI Amanda Mcmahon is a 26 y.o. female.   The history is provided by the patient.   Patient presents for complaints of sore throat and headache that have been present for the last several days.  She states that she has pain with swallowing, and feels like her lymph nodes are swollen.  She also states that she has some nasal congestion.  She denies fever, chills, ear pain, cough, wheezing, shortness of breath, difficulty breathing, or GI symptoms.  States that she has been taking Tylenol and using tea for her symptoms.  She denies any known sick contacts.  Past Medical History:  Diagnosis Date   Medical history non-contributory    Osteopenia 02/09/2021   Discontinue provera, recheck bone density in 2024    Patient Active Problem List   Diagnosis Date Noted   Screening examination for STD (sexually transmitted disease) 05/20/2022   Irregular bleeding 05/20/2022   Pregnancy examination or test, negative result 05/20/2022   Ectopic pregnancy 08/31/2021   Osteopenia 02/09/2021   Encounter for health maintenance examination 07/17/2020   Encounter for surveillance of injectable contraceptive 07/17/2020    Past Surgical History:  Procedure Laterality Date   LAPAROSCOPIC UNILATERAL SALPINGECTOMY Right 08/31/2021   Procedure: LAPAROSCOPIC RIGHT SALPINGECTOMY WITH REMOVAL OF ECTOPIC PREGNANCY;  Surgeon: Adam Phenix, MD;  Location: [facility] OR;  Service: Gynecology;  Laterality: Right;   NO PAST SURGERIES      OB History     Gravida  2   Para  1   Term  1   Preterm      AB  1   Living  1      SAB      IAB      Ectopic  1   Multiple  0   Live Births  1            Home Medications    Prior to Admission medications   Medication Sig Start Date End Date Taking?  Authorizing Provider  cetirizine (ZYRTEC) 10 MG tablet Take 1 tablet (10 mg total) by mouth daily. 09/02/22  Yes Eitan Doubleday-Warren, Sadie Haber, NP  lidocaine (XYLOCAINE) 2 % solution Use as directed 15 mLs in the mouth or throat as needed for mouth pain. Gargle and spit 5 mL up to 3 times daily as needed for severe throat pain. 09/02/22  Yes Azarion Hove-Warren, Sadie Haber, NP  acetaminophen (TYLENOL) 500 MG tablet Take 500 mg by mouth every 6 (six) hours as needed.    [provider]  amoxicillin-clavulanate (AUGMENTIN) 875-125 MG tablet Take 1 tablet by mouth every 12 (twelve) hours. Patient not taking: Reported on 05/20/2022 05/15/22   Particia Nearing, PA-C  medroxyPROGESTERone (DEPO-PROVERA) 150 MG/ML injection Inject 1 mL (150 mg total) into the muscle every 3 (three) months. 12/11/21   Adline Potter, NP    Family History Family History  Problem Relation Age of Onset   Diabetes Paternal Grandmother    Asthma Maternal Grandmother    Cancer Maternal Grandmother        lung   COPD Maternal Grandmother    Depression Maternal Grandmother    Diabetes Maternal Grandmother    Hypertension Maternal Grandmother    Stroke Maternal Grandmother  Hypertension Maternal Grandfather    Arthritis Mother     Social History Social History   Tobacco Use   Smoking status: Never   Smokeless tobacco: Never  Vaping Use   Vaping Use: Never used  Substance Use Topics   Alcohol use: Yes    Comment: occ   Drug use: Not Currently    Types: Marijuana    Comment: socially     Allergies   Patient has no known allergies.   Review of Systems Review of Systems Per HPI  Physical Exam Triage Vital Signs ED Triage Vitals  Enc Vitals Group     BP 09/02/22 1641 101/71     Pulse Rate 09/02/22 1641 96     Resp 09/02/22 1641 16     Temp 09/02/22 1641 98.4 F (36.9 C)     Temp Source 09/02/22 1641 Oral     SpO2 09/02/22 1641 98 %     Weight --      Height --      Head Circumference --       Peak Flow --      Pain Score 09/02/22 1638 8     Pain Loc --      Pain Edu? --      Excl. in GC? --    No data found.  Updated Vital Signs BP 101/71 (BP Location: Right Arm)   Pulse 96   Temp 98.4 F (36.9 C) (Oral)   Resp 16   SpO2 98%   Visual Acuity Right Eye Distance:   Left Eye Distance:   Bilateral Distance:    Right Eye Near:   Left Eye Near:    Bilateral Near:     Physical Exam Vitals and nursing note reviewed.  Constitutional:      General: She is not in acute distress.    Appearance: She is well-developed.  HENT:     Head: Normocephalic.     Right Ear: Tympanic membrane, ear canal and external ear normal.     Left Ear: Tympanic membrane, ear canal and external ear normal.     Nose: Congestion present.     Right Turbinates: Enlarged and swollen.     Left Turbinates: Enlarged and swollen.     Right Sinus: No maxillary sinus tenderness or frontal sinus tenderness.     Left Sinus: No maxillary sinus tenderness or frontal sinus tenderness.     Mouth/Throat:     Lips: Pink.     Mouth: Mucous membranes are moist.     Pharynx: Uvula midline. Pharyngeal swelling and posterior oropharyngeal erythema present. No oropharyngeal exudate or uvula swelling.     Tonsils: 1+ on the right. 1+ on the left.  Eyes:     Extraocular Movements: Extraocular movements intact.     Conjunctiva/sclera: Conjunctivae normal.     Pupils: Pupils are equal, round, and reactive to light.  Cardiovascular:     Rate and Rhythm: Normal rate and regular rhythm.  Pulmonary:     Effort: Pulmonary effort is normal. No respiratory distress.     Breath sounds: Normal breath sounds. No stridor. No wheezing, rhonchi or rales.  Abdominal:     General: Bowel sounds are normal.     Palpations: Abdomen is soft.     Tenderness: There is no abdominal tenderness.  Musculoskeletal:     Cervical back: Normal range of motion.  Lymphadenopathy:     Cervical: No cervical adenopathy.  Skin:     General: Skin is warm  and dry.  Neurological:     General: No focal deficit present.     Mental Status: She is alert and oriented to person, place, and time.  Psychiatric:        Mood and Affect: Mood normal.        Behavior: Behavior normal.      UC Treatments / Results  Labs (all labs ordered are listed, but only abnormal results are displayed) Labs Reviewed  CULTURE, GROUP A STREP [facility]  POCT RAPID STREP A (OFFICE)    EKG   Radiology No results found.  Procedures Procedures (including critical care time)  Medications Ordered in UC Medications - No data to display  Initial Impression / Assessment and Plan / UC Course  I have reviewed the triage vital signs and the nursing notes.  Pertinent labs & imaging results that were available during my care of the patient were reviewed by me and considered in my medical decision making (see chart for details).  Rapid strep test is negative, throat culture is pending.  Symptoms likely of viral etiology.  Patient was prescribed viscous lidocaine 2% for severe throat pain.  Recommended continued supportive care to include over-the-counter ibuprofen or Tylenol for pain, fever, general discomfort, warm salt water gargles, and a soft diet until symptoms improve.  Patient advised to follow-up in 7 to 10 days if symptoms do not improve or suddenly worsen.  Patient verbalizes understanding..  All questions were answered.  Patient is stable for discharge. Final Clinical Impressions(s) / UC Diagnoses   Final diagnoses:  Sore throat  Nonintractable headache, unspecified chronicity pattern, unspecified headache type     Discharge Instructions      The rapid strep test is negative, a throat culture is pending.  You will be contacted if the throat culture results are positive. Take medication as prescribed. Increase fluids and allow for plenty of rest. Warm salt water gargles 3-4 times daily while symptoms persist. Continue use of warm  tea to help with throat pain or discomfort. Recommend a diet with soft foods until throat pain improves.  This includes soup, broth, yogurt, pudding, fruit, or Jell-O. Follow-up if symptoms do not improve over the next 7 to 10 days.      ED Prescriptions     Medication Sig Dispense Auth. Provider   lidocaine (XYLOCAINE) 2 % solution Use as directed 15 mLs in the mouth or throat as needed for mouth pain. Gargle and spit 5 mL up to 3 times daily as needed for severe throat pain. 100 mL Daurice Ovando-Warren, Alda Lea, NP   cetirizine (ZYRTEC) 10 MG tablet Take 1 tablet (10 mg total) by mouth daily. 30 tablet Tonisha Silvey-Warren, Alda Lea, NP      PDMP not reviewed this encounter.   Tish Men, NP 09/02/22 1805

## 2022-09-02 NOTE — ED Triage Notes (Signed)
Pt reports sore throat, swollen lymph nodes x 1 week; headache x 1. Tylenol gives some relief.

## 2022-09-02 NOTE — Discharge Instructions (Addendum)
The rapid strep test is negative, a throat culture is pending.  You will be contacted if the throat culture results are positive. Take medication as prescribed. Increase fluids and allow for plenty of rest. Warm salt water gargles 3-4 times daily while symptoms persist. Continue use of warm tea to help with throat pain or discomfort. Recommend a diet with soft foods until throat pain improves.  This includes soup, broth, yogurt, pudding, fruit, or Jell-O. Follow-up if symptoms do not improve over the next 7 to 10 days.

## 2022-09-05 LAB — CULTURE, GROUP A STREP (THRC)

## 2022-10-11 ENCOUNTER — Encounter: Payer: Self-pay | Admitting: Family Medicine

## 2022-11-24 ENCOUNTER — Ambulatory Visit: Payer: BLUE CROSS/BLUE SHIELD

## 2022-11-26 ENCOUNTER — Ambulatory Visit (INDEPENDENT_AMBULATORY_CARE_PROVIDER_SITE_OTHER): Payer: BLUE CROSS/BLUE SHIELD | Admitting: *Deleted

## 2022-11-26 DIAGNOSIS — Z3042 Encounter for surveillance of injectable contraceptive: Secondary | ICD-10-CM | POA: Diagnosis not present

## 2022-11-26 MED ORDER — MEDROXYPROGESTERONE ACETATE 150 MG/ML IM SUSP
150.0000 mg | Freq: Once | INTRAMUSCULAR | Status: AC
  Administered 2022-11-26: 150 mg via INTRAMUSCULAR

## 2022-11-26 NOTE — Progress Notes (Signed)
   NURSE VISIT- INJECTION  SUBJECTIVE:  Amanda Mcmahon is a 27 y.o. G15P1011 female here for a Depo Provera for contraception/period management. She is a GYN patient.   OBJECTIVE:  There were no vitals taken for this visit.  Appears well, in no apparent distress  Injection administered in: Right deltoid  No orders of the defined types were placed in this encounter.   ASSESSMENT: GYN patient Depo Provera for contraception/period management PLAN: Follow-up: in 11-13 weeks for next Depo   Janece Canterbury  11/26/2022 4:21 PM

## 2023-02-08 ENCOUNTER — Ambulatory Visit (INDEPENDENT_AMBULATORY_CARE_PROVIDER_SITE_OTHER): Payer: BLUE CROSS/BLUE SHIELD | Admitting: Family Medicine

## 2023-02-08 VITALS — BP 107/76 | HR 101 | Temp 98.6°F | Ht 63.0 in | Wt 174.0 lb

## 2023-02-08 DIAGNOSIS — K0889 Other specified disorders of teeth and supporting structures: Secondary | ICD-10-CM

## 2023-02-08 DIAGNOSIS — R519 Headache, unspecified: Secondary | ICD-10-CM | POA: Diagnosis not present

## 2023-02-08 MED ORDER — SUMATRIPTAN SUCCINATE 50 MG PO TABS: 50.0000 mg | ORAL_TABLET | Freq: Once | ORAL | 0 refills | Status: DC

## 2023-02-08 MED ORDER — AMOXICILLIN 500 MG PO CAPS: 500.0000 mg | ORAL_CAPSULE | Freq: Three times a day (TID) | ORAL | 0 refills | Status: AC

## 2023-02-08 NOTE — Patient Instructions (Signed)
Medication as prescribed.  If it continues to persist or worsens, please let us know.  Take care  Dr. Adriana Simas

## 2023-02-08 NOTE — Progress Notes (Signed)
Subjective:  Patient ID: Amanda Mcmahon, female    DOB: August 02, 1996  Age: 27 y.o. MRN: [id number]  CC: Chief Complaint  Patient presents with   Headache    Going on for a month taking extra strength tylenol   Dental Pain    Since Saturday 2 days ago    HPI:  27 year old female presents for evaluation of the above.  Patient reports intermittent headaches for the past month.  She states that she currently has a headache and has had one for the past 3 days.  Located in the frontal region bilaterally and behind the eyes.  No associated nausea.  She does report photophobia.  Described as throbbing and sharp.  She reports that she has taken Tylenol, used a cold washcloth, and tried rest without resolution.  Has a family history of migraine.  She has never had a headache like this before.  No reports of difficulties with her vision.  No reports of weakness.  She does note that she has allergies and has had some sinus pressure.  Patient also reports that for the past 2 days she has had a tooth ache.  Location: Left upper molar.  Patient Active Problem List   Diagnosis Date Noted   Headache 02/08/2023   Pain, dental 02/08/2023   Osteopenia 02/09/2021   Encounter for surveillance of injectable contraceptive 07/17/2020    Social Hx   Social History   Socioeconomic History   Marital status: Single    Spouse name: Not on file   Number of children: 1   Years of education: Not on file   Highest education level: Bachelor's degree (e.g., BA, AB, BS)  Occupational History   Not on file  Tobacco Use   Smoking status: Never   Smokeless tobacco: Never  Vaping Use   Vaping Use: Never used  Substance and Sexual Activity   Alcohol use: Yes    Comment: occ   Drug use: Not Currently    Types: Marijuana    Comment: socially   Sexual activity: Yes    Birth control/protection: Injection  Other Topics Concern   Not on file  Social History Narrative   Not on file   Social Determinants of  Health   Financial Resource Strain: Low Risk  (02/08/2023)   Overall Financial Resource Strain (CARDIA)    Difficulty of Paying Living Expenses: Not very hard  Food Insecurity: No Food Insecurity (02/08/2023)   Hunger Vital Sign    Worried About Running Out of Food in the Last Year: Never true    Ran Out of Food in the Last Year: Never true  Transportation Needs: No Transportation Needs (02/08/2023)   PRAPARE - Administrator, Civil Service (Medical): No    Lack of Transportation (Non-Medical): No  Physical Activity: Sufficiently Active (02/08/2023)   Exercise Vital Sign    Days of Exercise per Week: 5 days    Minutes of Exercise per Session: 130 min  Stress: No Stress Concern Present (02/08/2023)   [employer] of Occupational Health - Occupational Stress Questionnaire    Feeling of Stress : Not at all  Social Connections: Unknown (02/08/2023)   Social Connection and Isolation Panel [NHANES]    Frequency of Communication with Friends and Family: More than three times a week    Frequency of Social Gatherings with Friends and Family: Three times a week    Attends Religious Services: More than 4 times per year    Active Member of Clubs  or Organizations: Yes    Attends Engineer, structural: More than 4 times per year    Marital Status: Patient declined    Review of Systems Per HPI  Objective:  BP 107/76   Pulse (!) 101   Temp 98.6 F (37 C)   Ht 5\' 3"  (1.6 m)   Wt 174 lb (78.9 kg)   SpO2 98%   BMI 30.82 kg/m      02/08/2023    3:58 PM 09/02/2022    4:41 PM 05/20/2022    2:03 PM  BP/Weight  Systolic BP 107 101 120  Diastolic BP 76 71 81  Wt. (Lbs) 174  174.5  BMI 30.82 kg/m2  30.91 kg/m2    Physical Exam Vitals and nursing note reviewed.  Constitutional:      General: She is not in acute distress.    Appearance: She is well-developed.  HENT:     Head: Normocephalic and atraumatic.     Mouth/Throat:     Pharynx: Oropharynx is clear.      Comments:  Labeled tooth is tender to palpation. Eyes:     Pupils: Pupils are equal, round, and reactive to light.  Cardiovascular:     Rate and Rhythm: Normal rate and regular rhythm.  Pulmonary:     Effort: Pulmonary effort is normal.     Breath sounds: Normal breath sounds. No wheezing or rales.  Neurological:     General: No focal deficit present.     Mental Status: She is alert.  Psychiatric:        Mood and Affect: Mood normal.        Behavior: Behavior normal.     Lab Results  Component Value Date   WBC 8.1 09/04/2021   HGB 14.4 09/04/2021   HCT 40.1 09/04/2021   PLT 327 09/04/2021   GLUCOSE 93 09/04/2021   CHOL 171 09/18/2020   TRIG 148 09/18/2020   HDL 38 (L) 09/18/2020   LDLCALC 107 (H) 09/18/2020   ALT 27 09/04/2021   AST 21 09/04/2021   NA 136 09/04/2021   K 4.1 09/04/2021   CL 105 09/04/2021   CREATININE 0.77 09/04/2021   BUN 7 09/04/2021   CO2 25 09/04/2021     Assessment & Plan:   Problem List Items Addressed This Visit       Other   Pain, dental    Placing on amoxicillin to cover for infection.  Advised to see dentist.      Headache - Primary    Tension versus migraine.  Given her age and history, I favor migraine.  Trial of Imitrex.      Relevant Medications   SUMAtriptan (IMITREX) 50 MG tablet    Meds ordered this encounter  Medications   amoxicillin (AMOXIL) 500 MG capsule    Sig: Take 1 capsule (500 mg total) by mouth 3 (three) times daily for 10 days.    Dispense:  30 capsule    Refill:  0   SUMAtriptan (IMITREX) 50 MG tablet    Sig: Take 1 tablet (50 mg total) by mouth once for 1 dose. At the onset of migraine. May repeat in 2 hours if headache persists or recurs.    Dispense:  10 tablet    Refill:  0    Follow-up:  Return if symptoms worsen or fail to improve.  Everlene Other DO [facility]

## 2023-02-08 NOTE — Assessment & Plan Note (Signed)
Tension versus migraine.  Given her age and history, I favor migraine.  Trial of Imitrex.

## 2023-02-08 NOTE — Assessment & Plan Note (Signed)
Placing on amoxicillin to cover for infection.  Advised to see dentist.

## 2023-02-10 ENCOUNTER — Telehealth: Payer: Self-pay | Admitting: Family Medicine

## 2023-02-10 ENCOUNTER — Telehealth: Payer: Self-pay

## 2023-02-10 NOTE — Telephone Encounter (Signed)
Patient was seen on 02/08/23 with migraines and was told to call back today if not better. She states headaches are not better even with taking medication. Please advise

## 2023-02-10 NOTE — Telephone Encounter (Signed)
Patient called about the headaches she was seen for the other day taking antibiotics and migraine medicine. She is she also taking excedrin for headache. Would there be anything else that can be taken or done for it. Reminded patient dental exam was also recommended. Please advise

## 2023-02-10 NOTE — Telephone Encounter (Signed)
Message sent to provider 

## 2023-02-12 ENCOUNTER — Encounter: Payer: Self-pay | Admitting: Neurology

## 2023-02-12 ENCOUNTER — Other Ambulatory Visit: Payer: Self-pay | Admitting: Family Medicine

## 2023-02-12 ENCOUNTER — Other Ambulatory Visit: Payer: Self-pay

## 2023-02-12 DIAGNOSIS — R519 Headache, unspecified: Secondary | ICD-10-CM

## 2023-02-12 MED ORDER — SUMATRIPTAN SUCCINATE 50 MG PO TABS: 50.0000 mg | ORAL_TABLET | Freq: Once | ORAL | 3 refills | Status: DC

## 2023-02-12 NOTE — Telephone Encounter (Signed)
Patient informed per drs orders and recommendations.  

## 2023-02-12 NOTE — Telephone Encounter (Signed)
Refill on  SUMAtriptan (IMITREX) 50 MG tablet ( send to [facility] [address]. Still having migraines. She was seen 02/08/23

## 2023-02-15 ENCOUNTER — Other Ambulatory Visit: Payer: Self-pay | Admitting: Adult Health

## 2023-02-15 DIAGNOSIS — G43009 Migraine without aura, not intractable, without status migrainosus: Secondary | ICD-10-CM | POA: Diagnosis not present

## 2023-02-15 DIAGNOSIS — M9901 Segmental and somatic dysfunction of cervical region: Secondary | ICD-10-CM | POA: Diagnosis not present

## 2023-02-17 ENCOUNTER — Ambulatory Visit: Payer: BLUE CROSS/BLUE SHIELD

## 2023-02-18 ENCOUNTER — Ambulatory Visit: Payer: BLUE CROSS/BLUE SHIELD

## 2023-02-23 ENCOUNTER — Ambulatory Visit (INDEPENDENT_AMBULATORY_CARE_PROVIDER_SITE_OTHER): Payer: BLUE CROSS/BLUE SHIELD | Admitting: *Deleted

## 2023-02-23 DIAGNOSIS — Z3042 Encounter for surveillance of injectable contraceptive: Secondary | ICD-10-CM | POA: Diagnosis not present

## 2023-02-23 MED ORDER — MEDROXYPROGESTERONE ACETATE 150 MG/ML IM SUSP
150.0000 mg | Freq: Once | INTRAMUSCULAR | Status: AC
  Administered 2023-02-23: 150 mg via INTRAMUSCULAR

## 2023-02-23 NOTE — Progress Notes (Signed)
   NURSE VISIT- INJECTION  SUBJECTIVE:  Amanda Mcmahon is a 27 y.o. G2P1011 female here for a Depo Provera for contraception/period management. She is a GYN patient.   OBJECTIVE:  There were no vitals taken for this visit.  Appears well, in no apparent distress  Injection administered in: Left deltoid  Meds ordered this encounter  Medications   medroxyPROGESTERone (DEPO-PROVERA) injection 150 mg    ASSESSMENT: GYN patient Depo Provera for contraception/period management PLAN: Follow-up: in 11-13 weeks for next Depo   Jobe Marker  02/23/2023 11:33 AM

## 2023-03-03 NOTE — Progress Notes (Signed)
Initial neurology clinic note  Amanda Mcmahon MRN: [id number] DOB: 1996/10/05  Referring provider: Tommie Sams, DO  Primary care provider: Babs Sciara, MD  Reason for consult:  headache  Subjective:  This is [name], a 27 y.o. right-handed female with no significant medical history who presents to neurology clinic with headaches. The patient is alone today.  Patient started having headaches about 1 month ago. She is not normally a headache person, only previously having a HA when she had COVID. She was not sick a month ago though. The headache was on the forehead area, felt like a pressure sensation, rated 10/10. She would have a near constant headache, with a few hours of no headache, but then the headache would return. She may have some blurry vision, but no photophobia, no phonophobia. She may have had mild nausea, but no vomiting. Patient would take NSAID and lay down, but nothing really helped. The HA was not worse when laying down. It did not radiate, but patient went to chiropractor who felt neck was tight. Since she has been seeing the chiropractor for the last couple of weeks, she has not had a headache.  Patient also saw her PCP who prescribed Imitrex 50 mg PRN. She took the full month, but it did not help. She does state that she would wait to see if the headache was getting worse before taking the medication. She does not currently have more of this medication.  She has not had a headache for a couple of weeks. Of note, patient had a minor tooth ache around the time of headache, but this resolved.   Patient saw her eye doctor 2 days ago. Everything looked good per her report, including dilated fundoscopy.  Patient's sister has migraines. Family history is otherwise negative.  Patient is on depo-provera birth control (no estrogen).  Caffeine: 3 cups of coffee per week, 2 sodas per week Non-smoker EtOH use: weekends - 3-4 shots No drug use  MEDICATIONS:   Outpatient Encounter Medications as of 03/11/2023  Medication Sig   acetaminophen (TYLENOL) 500 MG tablet Take 500 mg by mouth every 6 (six) hours as needed.   medroxyPROGESTERone (DEPO-PROVERA) 150 MG/ML injection Inject 1 mL (150 mg total) into the muscle every 3 (three) months. In office   SUMAtriptan (IMITREX) 50 MG tablet Take 1 tablet (50 mg total) by mouth once for 1 dose. At the onset of migraine. May repeat in 2 hours if headache persists or recurs.   No facility-administered encounter medications on file as of 03/11/2023.    PAST MEDICAL HISTORY: Past Medical History:  Diagnosis Date   Medical history non-contributory    Osteopenia 02/09/2021   Discontinue provera, recheck bone density in 2024    PAST SURGICAL HISTORY: Past Surgical History:  Procedure Laterality Date   LAPAROSCOPIC UNILATERAL SALPINGECTOMY Right 08/31/2021   Procedure: LAPAROSCOPIC RIGHT SALPINGECTOMY WITH REMOVAL OF ECTOPIC PREGNANCY;  Surgeon: Adam Phenix, MD;  Location: [facility] OR;  Service: Gynecology;  Laterality: Right;   NO PAST SURGERIES      ALLERGIES: No Known Allergies  FAMILY HISTORY: Family History  Problem Relation Age of Onset   Diabetes Paternal Grandmother    Asthma Maternal Grandmother    Cancer Maternal Grandmother        lung   COPD Maternal Grandmother    Depression Maternal Grandmother    Diabetes Maternal Grandmother    Hypertension Maternal Grandmother    Stroke Maternal Grandmother  Hypertension Maternal Grandfather    Arthritis Mother     SOCIAL HISTORY: Social History   Tobacco Use   Smoking status: Never   Smokeless tobacco: Never  Vaping Use   Vaping Use: Never used  Substance Use Topics   Alcohol use: Yes    Comment: occ   Drug use: Not Currently    Types: Marijuana    Comment: socially   Social History   Social History Narrative   Are you right handed or left handed? Right   Are you currently employed ?    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    What type of home do you live in: 1 story or 2 story? two   Caffeine 1 one daily     Objective:  Vital Signs:  BP (!) 97/52   Pulse 99   Ht 5\' 3"  (1.6 m)   Wt 176 lb (79.8 kg)   SpO2 98%   BMI 31.18 kg/m    General: No acute distress.  Patient appears well-groomed.   Head:  Normocephalic/atraumatic. No TMJ tenderness, clicks. Eyes:  fundi examined, disc margins crisp, no obvious papilledema Neck: supple, no paraspinal tenderness, full range of motion  Neurological Exam: Mental status: alert and oriented, speech fluent and not dysarthric, language intact.  Cranial nerves: CN I: not tested CN II: pupils equal, round and reactive to light, visual fields intact CN III, IV, VI:  full range of motion, no nystagmus, no ptosis CN V: facial sensation intact. CN VII: upper and lower face symmetric CN VIII: hearing intact CN IX, X: uvula midline CN XI: sternocleidomastoid and trapezius muscles intact CN XII: tongue midline  Bulk & Tone: normal. Motor:  muscle strength 5/5 throughout Deep Tendon Reflexes:  2+ throughout  Sensation:  Light touch sensation intact. Finger to nose testing:  Without dysmetria.    Gait:  Normal station and stride.  Romberg negative.  Labs and Imaging review: Internal labs: CMP (09/04/2021): wnl CBC (09/04/2021): wnl HIV (05/20/2022): nonreactive RPR (05/20/2022): nonreactive   Assessment/Plan:  Amanda Mcmahon is a 27 y.o. female who presents for evaluation of headaches. She has no relevant medical history. Her neurological examination is essentially normal today. The etiology of patient's headaches is currently unclear. There are no red flag symptoms associated with her headaches. Fortunately, after about 1 month of frontal headaches that are described as tension like headaches, her headaches resolved. She notes that she had tooth pain around this time that has resolved. She also mentions  the headaches stopped after she started going to a chiropractor who told her that her neck was tight. Tooth pain and/or neck pain could certainly explain the episode. She did not respond well to imitrex previously, but also was taking after headaches were severe, not at symptom onset. She does have a family history of migraine, but her headaches do not sound clearly like migraines at this time. I explained that she may have no further headaches, or begin to develop headaches. We can monitor for this. Based on her preference, I will refill sumatriptan for a 1 month supply in case headaches return. She will call or message me if she has return of headaches.  PLAN: -Blood work: B12, TSH -Will refill Sumatriptan. Patient instructed to take at headache onset if possible and can repeat once after 2 hours. -Discussed limiting use of pain relievers to no more than 2 days out  of week to prevent risk of rebound or medication-overuse headache. -Keep headache diary   -Return to clinic as needed  The impression above as well as the plan as outlined below were extensively discussed with the patient who voiced understanding. All questions were answered to their satisfaction.  When available, results of the above investigations and possible further recommendations will be communicated to the patient via telephone/MyChart. Patient to call office if not contacted after expected testing turnaround time.    Thank you for allowing me to participate in patient's care.  If I can answer any additional questions, I would be pleased to do so.  Jacquelyne Balint, MD   CC: Babs Sciara, MD [address] [place] [address]  CC: Referring provider: Tommie Sams, DO [address] [place],  [place] [address]

## 2023-03-11 ENCOUNTER — Encounter: Payer: Self-pay | Admitting: Neurology

## 2023-03-11 ENCOUNTER — Ambulatory Visit: Payer: BLUE CROSS/BLUE SHIELD | Admitting: Neurology

## 2023-03-11 ENCOUNTER — Other Ambulatory Visit (INDEPENDENT_AMBULATORY_CARE_PROVIDER_SITE_OTHER): Payer: BLUE CROSS/BLUE SHIELD

## 2023-03-11 VITALS — BP 97/52 | HR 99 | Ht 63.0 in | Wt 176.0 lb

## 2023-03-11 DIAGNOSIS — R519 Headache, unspecified: Secondary | ICD-10-CM | POA: Diagnosis not present

## 2023-03-11 DIAGNOSIS — K0889 Other specified disorders of teeth and supporting structures: Secondary | ICD-10-CM

## 2023-03-11 DIAGNOSIS — M542 Cervicalgia: Secondary | ICD-10-CM | POA: Diagnosis not present

## 2023-03-11 LAB — TSH: TSH: 0.48 u[IU]/mL (ref 0.35–5.50)

## 2023-03-11 LAB — VITAMIN B12: Vitamin B-12: 133 pg/mL — ABNORMAL LOW (ref 211–911)

## 2023-03-11 MED ORDER — SUMATRIPTAN SUCCINATE 50 MG PO TABS: 50.0000 mg | ORAL_TABLET | ORAL | 0 refills | Status: DC | PRN

## 2023-03-11 NOTE — Patient Instructions (Signed)
I saw you today for the month of headaches you had. Fortunately, these have resolved. It may have been caused by neck pain given that things improved with going to a chiropractor. It may have also been due to some tooth pain you were having.  I have refilled sumatriptan (imitrex) at your request. If you have another headache, take this the moment you feel the headache coming on. You can repeat once after 2 hours if needed. If you are getting headaches again, please reach out to me to let me know. Hopefully you will go back to having no headaches as before.  I am getting a couple of labs today. I will be in touch when I have those results.  Follow up with me as needed.  The physicians and staff at [facility] are committed to providing excellent care. You may receive a survey requesting feedback about your experience at our office. We strive to receive "very good" responses to the survey questions. If you feel that your experience would prevent you from giving the office a "very good " response, please contact our office to try to remedy the situation. We may be reached at [phone]. Thank you for taking the time out of your busy day to complete the survey.  Jacquelyne Balint, MD [facility]  General headache recommendations: Limit use of pain relievers to no more than 2 days out of the week.  These medications include acetaminophen, NSAIDs (ibuprofen/Advil/Motrin, naproxen/Aleve, triptans (Imitrex/sumatriptan), Excedrin, and narcotics.  This will help reduce risk of rebound headaches. Be aware of common food triggers:  - Caffeine:  coffee, black tea, cola, Mt. Dew  - Chocolate  - Dairy:  aged cheeses (brie, blue, cheddar, gouda, [place], provolone, [place], Swiss, etc), chocolate milk, buttermilk, sour cream, limit eggs and yogurt  - Nuts, peanut butter  - Alcohol  - Cereals/grains:  FRESH breads (fresh bagels, sourdough, doughnuts), yeast productions  - Processed/canned/aged/cured  meats (pre-packaged deli meats, hotdogs)  - MSG/glutamate:  soy sauce, flavor enhancer, pickled/preserved/marinated foods  - Sweeteners:  aspartame (Equal, Nutrasweet).  Sugar and Splenda are okay  - Vegetables:  legumes (lima beans, lentils, snow peas, fava beans, pinto peans, peas, garbanzo beans), sauerkraut, onions, olives, pickles  - Fruit:  avocados, bananas, citrus fruit (orange, lemon, grapefruit), mango  - Other:  Frozen meals, macaroni and cheese Routine exercise Stay adequately hydrated (aim for 64 oz water daily) Keep headache diary Maintain proper stress management Maintain proper sleep hygiene Do not skip meals Consider supplements:  magnesium citrate 400mg  daily, riboflavin 400mg  daily, coenzyme Q10 100mg  three times daily.

## 2023-03-15 ENCOUNTER — Other Ambulatory Visit: Payer: Self-pay | Admitting: Neurology

## 2023-03-15 DIAGNOSIS — R519 Headache, unspecified: Secondary | ICD-10-CM

## 2023-03-15 MED ORDER — RIZATRIPTAN BENZOATE 10 MG PO TBDP
10.0000 mg | ORAL_TABLET | ORAL | 11 refills | Status: AC | PRN
Start: 2023-03-15 — End: ?

## 2023-05-18 ENCOUNTER — Ambulatory Visit: Payer: Medicaid Other | Admitting: *Deleted

## 2023-05-18 DIAGNOSIS — Z3042 Encounter for surveillance of injectable contraceptive: Secondary | ICD-10-CM | POA: Diagnosis not present

## 2023-05-18 MED ORDER — MEDROXYPROGESTERONE ACETATE 150 MG/ML IM SUSP
150.0000 mg | Freq: Once | INTRAMUSCULAR | Status: AC
  Administered 2023-05-18: 150 mg via INTRAMUSCULAR

## 2023-05-18 NOTE — Progress Notes (Signed)
   NURSE VISIT- INJECTION  SUBJECTIVE:  Amanda Mcmahon is a 27 y.o. G43P1011 female here for a Depo Provera for contraception/period management. She is a GYN patient.   OBJECTIVE:  There were no vitals taken for this visit.  Appears well, in no apparent distress  Injection administered in: Right deltoid  No orders of the defined types were placed in this encounter.   ASSESSMENT: GYN patient Depo Provera for contraception/period management PLAN: Follow-up: in 11-13 weeks for next Depo   Annamarie Dawley  05/18/2023 4:11 PM

## 2023-08-10 ENCOUNTER — Ambulatory Visit: Payer: Medicaid Other

## 2023-08-10 DIAGNOSIS — Z3042 Encounter for surveillance of injectable contraceptive: Secondary | ICD-10-CM

## 2023-08-10 MED ORDER — MEDROXYPROGESTERONE ACETATE 150 MG/ML IM SUSY
150.0000 mg | PREFILLED_SYRINGE | Freq: Once | INTRAMUSCULAR | Status: AC
  Administered 2023-08-10: 150 mg via INTRAMUSCULAR

## 2023-08-10 NOTE — Progress Notes (Signed)
   NURSE VISIT- INJECTION  SUBJECTIVE:  Amanda Mcmahon is a 27 y.o. G77P1011 female here for a Depo Provera for contraception/period management. She is a GYN patient.   OBJECTIVE:  There were no vitals taken for this visit.  Appears well, in no apparent distress  Injection administered in: Right deltoid  Meds ordered this encounter  Medications   medroxyPROGESTERone Acetate SUSY 150 mg    ASSESSMENT: GYN patient Depo Provera for contraception/period management PLAN: Follow-up: in 11-13 weeks for next Depo   Caralyn Guile  08/10/2023 4:26 PM

## 2023-11-01 ENCOUNTER — Ambulatory Visit: Payer: Medicaid Other

## 2023-11-15 ENCOUNTER — Encounter: Payer: Self-pay | Admitting: Family Medicine

## 2024-03-12 ENCOUNTER — Emergency Department (HOSPITAL_COMMUNITY)

## 2024-03-12 ENCOUNTER — Other Ambulatory Visit: Payer: Self-pay

## 2024-03-12 ENCOUNTER — Encounter (HOSPITAL_COMMUNITY): Payer: Self-pay | Admitting: *Deleted

## 2024-03-12 ENCOUNTER — Emergency Department (HOSPITAL_COMMUNITY)
Admission: EM | Admit: 2024-03-12 | Discharge: 2024-03-12 | Disposition: A | Discharge status: Home / Self Care | Attending: Emergency Medicine | Admitting: Emergency Medicine

## 2024-03-12 DIAGNOSIS — S99912A Unspecified injury of left ankle, initial encounter: Secondary | ICD-10-CM | POA: Diagnosis present

## 2024-03-12 DIAGNOSIS — M7989 Other specified soft tissue disorders: Secondary | ICD-10-CM | POA: Diagnosis not present

## 2024-03-12 DIAGNOSIS — S8262XA Displaced fracture of lateral malleolus of left fibula, initial encounter for closed fracture: Secondary | ICD-10-CM | POA: Diagnosis not present

## 2024-03-12 DIAGNOSIS — X501XXA Overexertion from prolonged static or awkward postures, initial encounter: Secondary | ICD-10-CM | POA: Diagnosis not present

## 2024-03-12 DIAGNOSIS — S82892A Other fracture of left lower leg, initial encounter for closed fracture: Secondary | ICD-10-CM

## 2024-03-12 MED ORDER — IBUPROFEN 800 MG PO TABS
800.0000 mg | ORAL_TABLET | Freq: Once | ORAL | Status: AC
  Administered 2024-03-12: 800 mg via ORAL
  Filled 2024-03-12: qty 1

## 2024-03-12 MED ORDER — HYDROCODONE-ACETAMINOPHEN 5-325 MG PO TABS
1.0000 | ORAL_TABLET | Freq: Once | ORAL | Status: AC
  Administered 2024-03-12: 1 via ORAL
  Filled 2024-03-12: qty 1

## 2024-03-12 MED ORDER — IBUPROFEN 600 MG PO TABS: 600.0000 mg | ORAL_TABLET | Freq: Three times a day (TID) | ORAL | 0 refills | Status: AC

## 2024-03-12 MED ORDER — HYDROCODONE-ACETAMINOPHEN 5-325 MG PO TABS: 1.0000 | ORAL_TABLET | ORAL | 0 refills | Status: AC | PRN

## 2024-03-12 NOTE — ED Notes (Signed)
 Discharge instructions explained. Boot applied and crutches adjusted. Pt discharged with family.

## 2024-03-12 NOTE — Discharge Instructions (Signed)
 As discussed, you will need followup care for this injury and you need to remain non weight bearing until advised otherwise by the orthopedic specialist.  Use ice and elevation as much as possible over the next week, wear the boot and use crutches to protect your injury.  You may take the hydrocodone  prescribed for pain relief.  This will make you drowsy - do not drive within 4 hours of taking this medication.  Either call Dr. Hulda Mage or an orthopedist of choice in [place] when you return home.

## 2024-03-12 NOTE — ED Triage Notes (Signed)
 Pt with left ankle pain since her foot got caught while getting off AT[employer] last night. Not able to put weight on it per pt.
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
